# Patient Record
Sex: Female | Born: 1975 | Race: White | Hispanic: No | Smoking: Former smoker
Health system: Southern US, Community
[De-identification: ages and names within clinical notes are randomized; demographics above are authoritative.]

## PROBLEM LIST (undated history)

## (undated) ENCOUNTER — Emergency Department (HOSPITAL_COMMUNITY): Admission: EM | Payer: Medicaid Other

## (undated) DIAGNOSIS — S8290XA Unspecified fracture of unspecified lower leg, initial encounter for closed fracture: Secondary | ICD-10-CM

## (undated) DIAGNOSIS — N189 Chronic kidney disease, unspecified: Secondary | ICD-10-CM

## (undated) DIAGNOSIS — J449 Chronic obstructive pulmonary disease, unspecified: Secondary | ICD-10-CM

## (undated) DIAGNOSIS — R1310 Dysphagia, unspecified: Secondary | ICD-10-CM

## (undated) HISTORY — PX: TUBAL LIGATION: SHX77

## (undated) HISTORY — DX: Chronic obstructive pulmonary disease, unspecified: J44.9

## (undated) HISTORY — PX: FRACTURE SURGERY: SHX138

## (undated) HISTORY — DX: Dysphagia, unspecified: R13.10

## (undated) HISTORY — DX: Chronic kidney disease, unspecified: N18.9

---

## 2002-03-19 ENCOUNTER — Other Ambulatory Visit: Admission: RE | Admit: 2002-03-19 | Discharge: 2002-03-19 | Payer: Self-pay | Admitting: Internal Medicine

## 2005-01-27 ENCOUNTER — Ambulatory Visit: Payer: Self-pay | Admitting: Family Medicine

## 2005-04-07 ENCOUNTER — Ambulatory Visit: Payer: Self-pay | Admitting: Family Medicine

## 2005-04-07 ENCOUNTER — Encounter (INDEPENDENT_AMBULATORY_CARE_PROVIDER_SITE_OTHER): Payer: Self-pay | Admitting: *Deleted

## 2005-04-18 ENCOUNTER — Ambulatory Visit (HOSPITAL_COMMUNITY): Admission: RE | Admit: 2005-04-18 | Discharge: 2005-04-18 | Payer: Self-pay | Admitting: Family Medicine

## 2005-04-26 ENCOUNTER — Ambulatory Visit: Payer: Self-pay | Admitting: Family Medicine

## 2005-05-10 ENCOUNTER — Ambulatory Visit (HOSPITAL_COMMUNITY): Admission: RE | Admit: 2005-05-10 | Discharge: 2005-05-10 | Payer: Self-pay | Admitting: Family Medicine

## 2005-08-25 ENCOUNTER — Ambulatory Visit: Payer: Self-pay | Admitting: Family Medicine

## 2005-10-11 ENCOUNTER — Ambulatory Visit: Payer: Self-pay | Admitting: Internal Medicine

## 2005-12-08 ENCOUNTER — Ambulatory Visit (HOSPITAL_COMMUNITY): Admission: RE | Admit: 2005-12-08 | Discharge: 2005-12-08 | Payer: Self-pay | Admitting: Family Medicine

## 2005-12-08 ENCOUNTER — Ambulatory Visit: Payer: Self-pay | Admitting: Family Medicine

## 2006-05-10 ENCOUNTER — Emergency Department (HOSPITAL_COMMUNITY): Admission: EM | Admit: 2006-05-10 | Discharge: 2006-05-10 | Payer: Self-pay | Admitting: Emergency Medicine

## 2006-05-16 ENCOUNTER — Ambulatory Visit: Payer: Self-pay | Admitting: Family Medicine

## 2006-06-16 IMAGING — CT CT PELVIS W/ CM
1 of 3 series · 14 of 32 positions shown, 19 images · IV contrast (agent unspecified)
Comparison: Abdominal ultrasound 04/18/2005.

CLINICAL DATA: Six month history of abdominal pain and diarrhea.

CT ABDOMEN AND PELVIS WITH CONTRAST 05/10/2005:
TECHNIQUE: Multidetector helical CT of the abdomen and pelvis was performed
during bolus administration of intravenous contrast. Oral contrast was given.
Delayed imaging through the kidneys was performed.
Contrast:  150 cc 3mnipaque-GLL.

[Series 9436: — · axial · 0.85mm/px · z∈[+1399,+1859]mm · 14 of 106 slices shown, 19 images]
[im 7/106  soft-tissue]
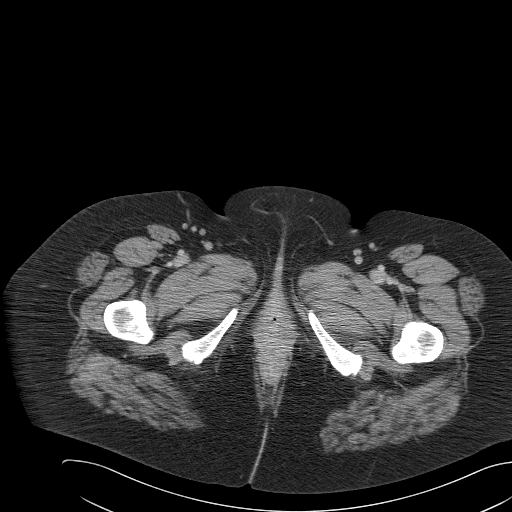
[im 7/106  bone]
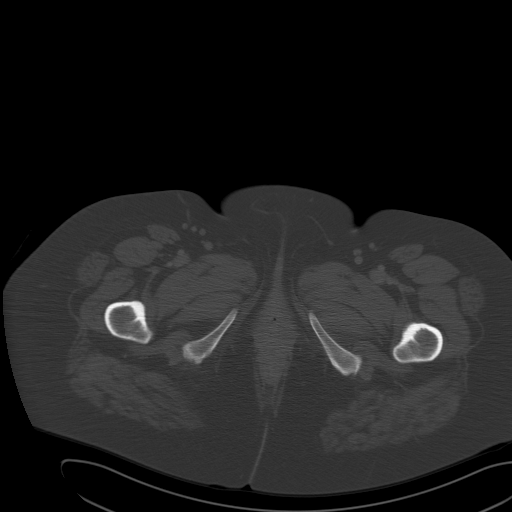
[im 13/106  soft-tissue]
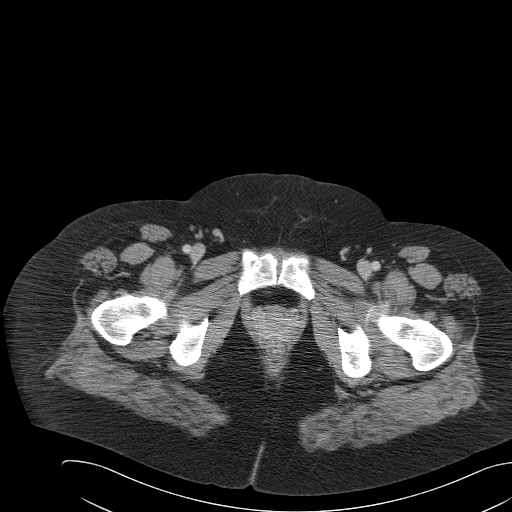
[im 25/106  soft-tissue]
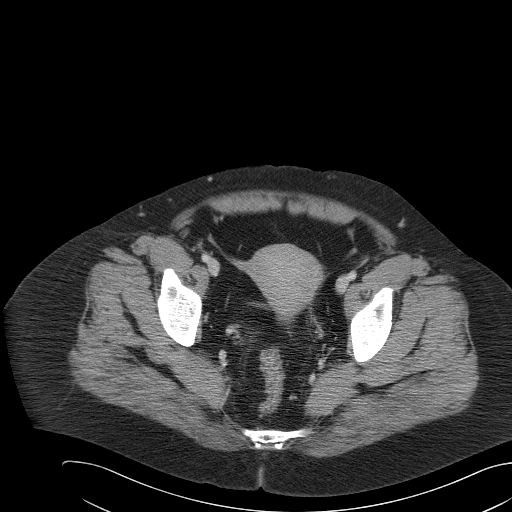
[im 31/106  soft-tissue]
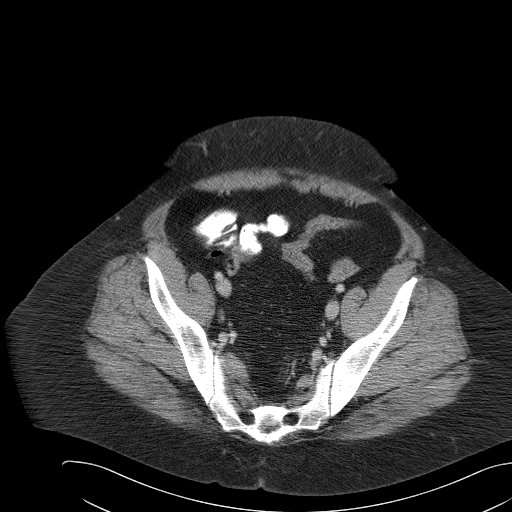
[im 38/106  soft-tissue]
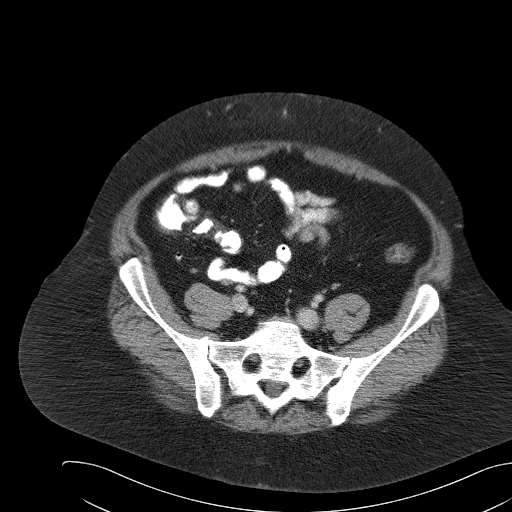
[im 44/106  soft-tissue]
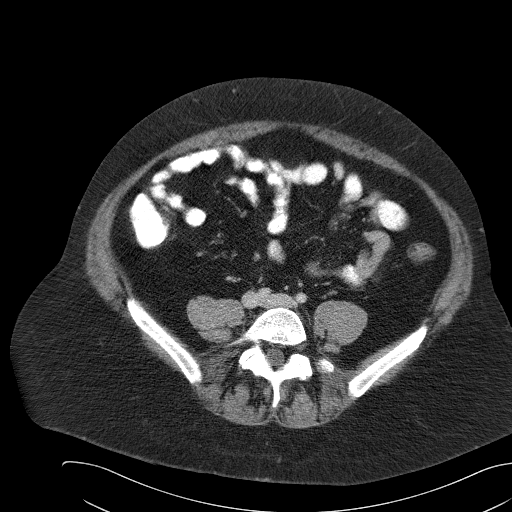
[im 56/106  soft-tissue]
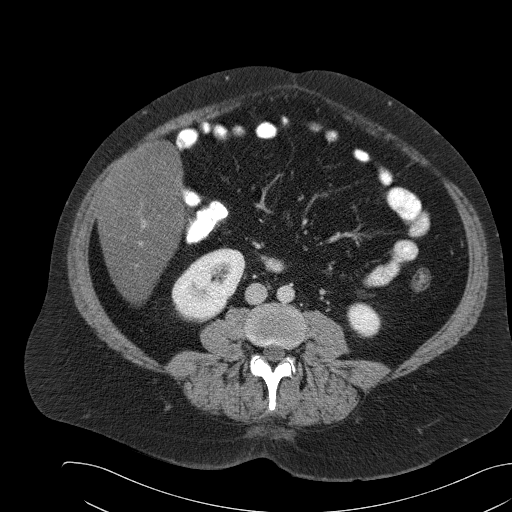
[im 62/106  soft-tissue]
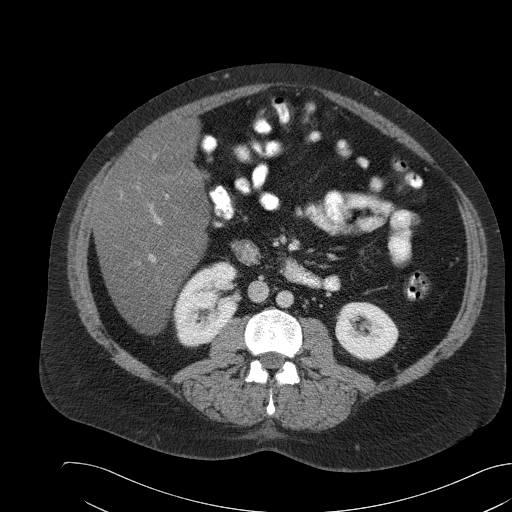
[im 68/106  soft-tissue]
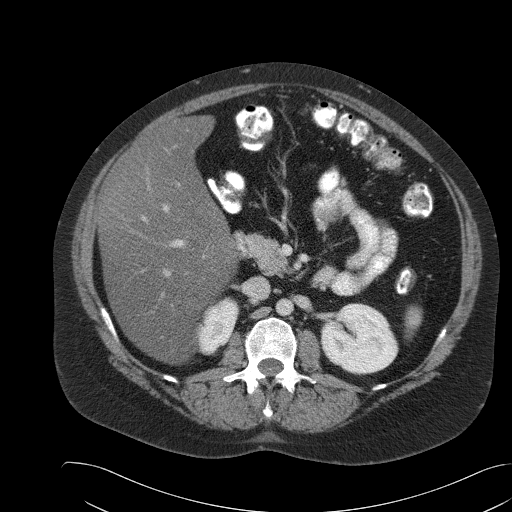
[im 68/106  bone]
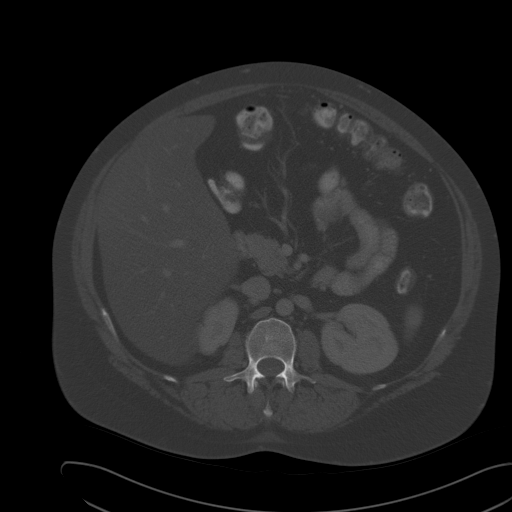
[im 75/106  soft-tissue]
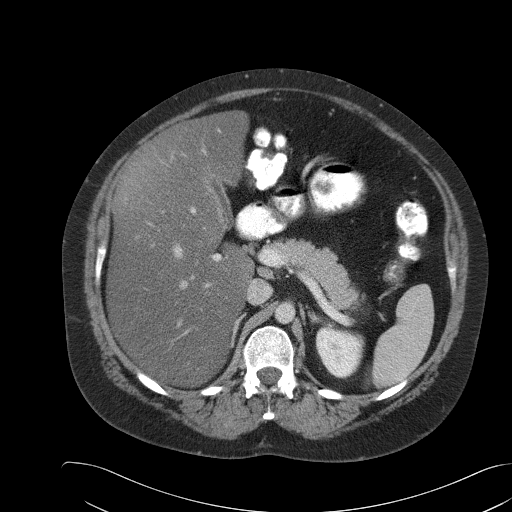
[im 81/106  soft-tissue]
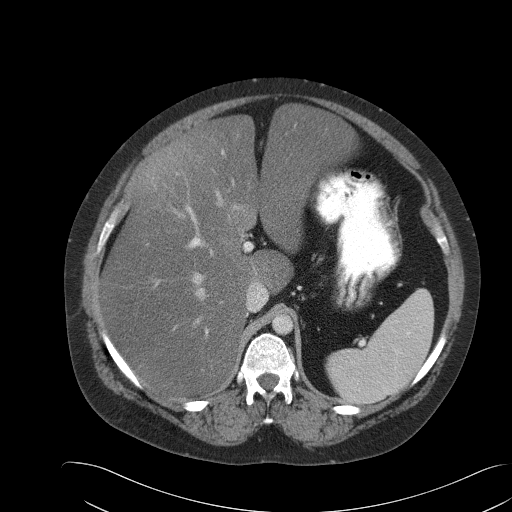
[im 81/106  lung]
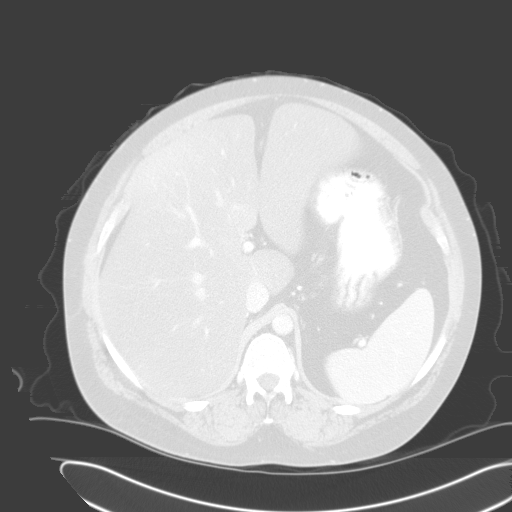
[im 87/106  lung]
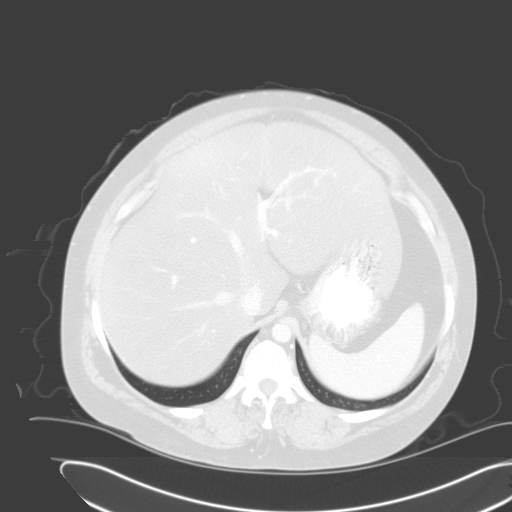
[im 93/106  soft-tissue]
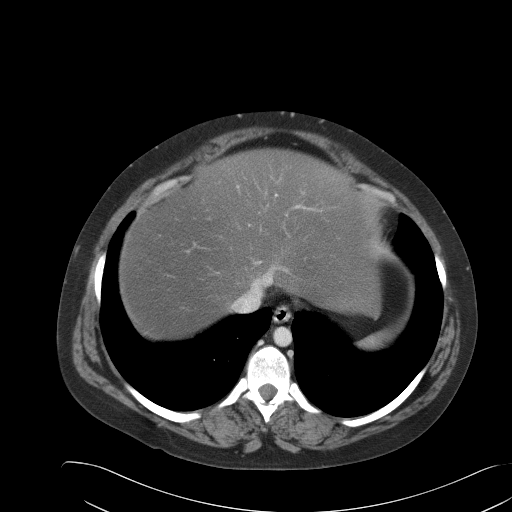
[im 93/106  lung]
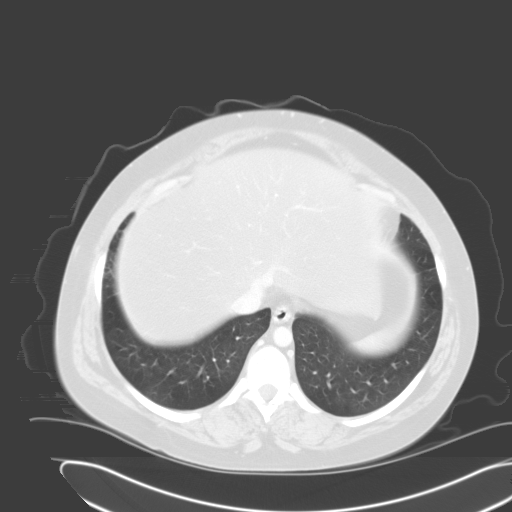
[im 99/106  soft-tissue]
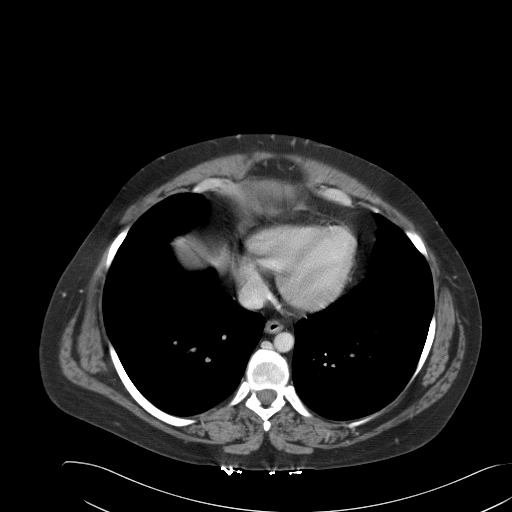
[im 99/106  lung]
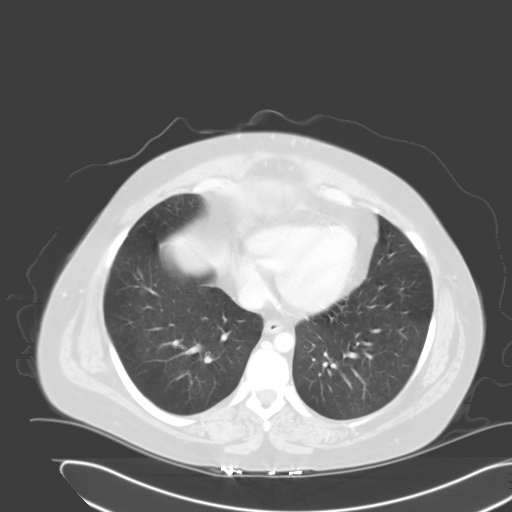

[14 of 32 positions shown; findings below may reference images not displayed]

No prior CT examinations.

CT ABDOMEN:

The liver is enlarged as was noted on the previous ultrasound. There is severe
diffuse fatty infiltration of the liver, with focal areas of sparing in the
periphery of the liver. No focal hepatic abnormalities are identified. The
spleen is normal in size and appearance. The pancreas is normal. The gallbladder
is contracted but otherwise unremarkable, and was normal on the prior
ultrasound. Both adrenal glands and both kidneys are normal in appearance. The
stomach and the visualized colon and small bowel are unremarkable in the
abdomen. There is no ascites. There is no significant lymphadenopathy. Abdominal
aorta is normal in appearance. The visualized lung bases appear clear.
IMPRESSION: 1. Marked hepatomegaly with diffuse fatty infiltration of the liver. No focal
hepatic abnormalities.
2. Normal CT of the abdomen otherwise.

CT PELVIS:

The sigmoid colon and rectum are decompressed, though no significant thickening
of the wall is identified. There is no evidence of diverticulosis. The small
bowel has normal appearance. The appendix is identified in the right mid pelvis
and is normal. The uterus and adnexa are unremarkable apart from small
calcifications in both ovaries. There is no free fluid. There is no significant
lymphadenopathy. Urinary bladder is decompressed and normal in appearance.
IMPRESSION: 1. No acute abnormalities in the pelvis.
2. Bilateral ovarian calcifications which are statistically likely to be benign.

## 2006-06-29 ENCOUNTER — Encounter (HOSPITAL_COMMUNITY): Admission: RE | Admit: 2006-06-29 | Discharge: 2006-07-29 | Payer: Self-pay | Admitting: Neurology

## 2006-08-08 ENCOUNTER — Encounter (HOSPITAL_COMMUNITY): Admission: RE | Admit: 2006-08-08 | Discharge: 2006-09-07 | Payer: Self-pay | Admitting: Neurology

## 2006-09-20 ENCOUNTER — Encounter (HOSPITAL_COMMUNITY): Admission: RE | Admit: 2006-09-20 | Discharge: 2006-09-23 | Payer: Self-pay | Admitting: Neurology

## 2006-09-26 ENCOUNTER — Encounter (HOSPITAL_COMMUNITY): Admission: RE | Admit: 2006-09-26 | Discharge: 2006-10-26 | Payer: Self-pay | Admitting: Neurology

## 2007-07-10 ENCOUNTER — Ambulatory Visit (HOSPITAL_COMMUNITY): Admission: RE | Admit: 2007-07-10 | Discharge: 2007-07-10 | Payer: Self-pay | Admitting: Family Medicine

## 2007-07-10 ENCOUNTER — Ambulatory Visit: Payer: Self-pay | Admitting: Family Medicine

## 2007-12-15 ENCOUNTER — Encounter (INDEPENDENT_AMBULATORY_CARE_PROVIDER_SITE_OTHER): Payer: Self-pay | Admitting: *Deleted

## 2008-03-14 DIAGNOSIS — E119 Type 2 diabetes mellitus without complications: Secondary | ICD-10-CM | POA: Insufficient documentation

## 2008-03-14 DIAGNOSIS — E785 Hyperlipidemia, unspecified: Secondary | ICD-10-CM | POA: Insufficient documentation

## 2009-04-17 ENCOUNTER — Encounter: Payer: Self-pay | Admitting: Family Medicine

## 2009-06-01 ENCOUNTER — Ambulatory Visit: Payer: Self-pay | Admitting: Family Medicine

## 2009-06-01 DIAGNOSIS — E669 Obesity, unspecified: Secondary | ICD-10-CM | POA: Insufficient documentation

## 2009-06-01 LAB — CONVERTED CEMR LAB: Glucose, Bld: 105 mg/dL

## 2009-06-03 DIAGNOSIS — F1010 Alcohol abuse, uncomplicated: Secondary | ICD-10-CM | POA: Insufficient documentation

## 2009-06-03 DIAGNOSIS — M79609 Pain in unspecified limb: Secondary | ICD-10-CM | POA: Insufficient documentation

## 2009-06-05 ENCOUNTER — Encounter: Payer: Self-pay | Admitting: Family Medicine

## 2009-06-17 ENCOUNTER — Telehealth: Payer: Self-pay | Admitting: Family Medicine

## 2009-06-18 ENCOUNTER — Telehealth: Payer: Self-pay | Admitting: Family Medicine

## 2010-02-19 ENCOUNTER — Encounter: Payer: Self-pay | Admitting: Family Medicine

## 2010-03-19 ENCOUNTER — Encounter: Payer: Self-pay | Admitting: Family Medicine

## 2011-01-16 ENCOUNTER — Encounter: Payer: Self-pay | Admitting: Orthopaedic Surgery

## 2011-01-16 ENCOUNTER — Encounter: Payer: Self-pay | Admitting: Family Medicine

## 2011-01-25 NOTE — Letter (Signed)
Summary: MEDICAL RELEASE  MEDICAL RELEASE   Imported By: Lind Guest 03/19/2010 10:01:35  _____________________________________________________________________  External Attachment:    Type:   Image     Comment:   External Document

## 2011-01-25 NOTE — Letter (Signed)
Summary: medical release  medical release   Imported By: Lind Guest 02/19/2010 14:42:26  _____________________________________________________________________  External Attachment:    Type:   Image     Comment:   External Document

## 2014-04-15 ENCOUNTER — Emergency Department (HOSPITAL_COMMUNITY): Payer: Self-pay

## 2014-04-15 ENCOUNTER — Encounter (HOSPITAL_COMMUNITY): Payer: Self-pay | Admitting: Emergency Medicine

## 2014-04-15 ENCOUNTER — Emergency Department (HOSPITAL_COMMUNITY)
Admission: EM | Admit: 2014-04-15 | Discharge: 2014-04-15 | Disposition: A | Discharge status: Home / Self Care | Payer: Self-pay | Attending: Emergency Medicine | Admitting: Emergency Medicine

## 2014-04-15 DIAGNOSIS — Y838 Other surgical procedures as the cause of abnormal reaction of the patient, or of later complication, without mention of misadventure at the time of the procedure: Secondary | ICD-10-CM | POA: Insufficient documentation

## 2014-04-15 DIAGNOSIS — L0291 Cutaneous abscess, unspecified: Secondary | ICD-10-CM

## 2014-04-15 DIAGNOSIS — T8189XA Other complications of procedures, not elsewhere classified, initial encounter: Secondary | ICD-10-CM | POA: Insufficient documentation

## 2014-04-15 DIAGNOSIS — F17200 Nicotine dependence, unspecified, uncomplicated: Secondary | ICD-10-CM | POA: Insufficient documentation

## 2014-04-15 DIAGNOSIS — L02419 Cutaneous abscess of limb, unspecified: Secondary | ICD-10-CM | POA: Insufficient documentation

## 2014-04-15 DIAGNOSIS — Z8781 Personal history of (healed) traumatic fracture: Secondary | ICD-10-CM | POA: Insufficient documentation

## 2014-04-15 DIAGNOSIS — L03119 Cellulitis of unspecified part of limb: Secondary | ICD-10-CM

## 2014-04-15 HISTORY — DX: Unspecified fracture of unspecified lower leg, initial encounter for closed fracture: S82.90XA

## 2014-04-15 LAB — COMPREHENSIVE METABOLIC PANEL
ALBUMIN: 3.4 g/dL — AB (ref 3.5–5.2)
ALK PHOS: 72 U/L (ref 39–117)
ALT: 9 U/L (ref 0–35)
AST: 12 U/L (ref 0–37)
BUN: 7 mg/dL (ref 6–23)
CO2: 27 mEq/L (ref 19–32)
Calcium: 9 mg/dL (ref 8.4–10.5)
Chloride: 101 mEq/L (ref 96–112)
Creatinine, Ser: 0.66 mg/dL (ref 0.50–1.10)
GFR calc Af Amer: >90 mL/min (ref >90)
GFR calc non Af Amer: >90 mL/min (ref >90)
Glucose, Bld: 101 mg/dL — ABNORMAL HIGH (ref 70–99)
POTASSIUM: 3.8 meq/L (ref 3.7–5.3)
SODIUM: 140 meq/L (ref 137–147)
TOTAL PROTEIN: 7.2 g/dL (ref 6.0–8.3)
Total Bilirubin: 0.4 mg/dL (ref 0.3–1.2)

## 2014-04-15 LAB — CBC WITH DIFFERENTIAL/PLATELET
BASOS ABS: 0 10*3/uL (ref 0.0–0.1)
BASOS PCT: 0 % (ref 0–1)
EOS ABS: 0.1 10*3/uL (ref 0.0–0.7)
Eosinophils Relative: 1 % (ref 0–5)
HCT: 42.6 % (ref 36.0–46.0)
Hemoglobin: 14.1 g/dL (ref 12.0–15.0)
Lymphocytes Relative: 26 % (ref 12–46)
Lymphs Abs: 2.1 10*3/uL (ref 0.7–4.0)
MCH: 33.1 pg (ref 26.0–34.0)
MCHC: 33.1 g/dL (ref 30.0–36.0)
MCV: 100 fL (ref 78.0–100.0)
Monocytes Absolute: 0.6 10*3/uL (ref 0.1–1.0)
Monocytes Relative: 7 % (ref 3–12)
NEUTROS PCT: 66 % (ref 43–77)
Neutro Abs: 5.4 10*3/uL (ref 1.7–7.7)
PLATELETS: 200 10*3/uL (ref 150–400)
RBC: 4.26 MIL/uL (ref 3.87–5.11)
RDW: 12.9 % (ref 11.5–15.5)
WBC: 8.2 10*3/uL (ref 4.0–10.5)

## 2014-04-15 LAB — C-REACTIVE PROTEIN: CRP: 0.8 mg/dL — ABNORMAL HIGH (ref <0.60)

## 2014-04-15 LAB — SEDIMENTATION RATE: Sed Rate: 23 mm/hr — ABNORMAL HIGH (ref 0–22)

## 2014-04-15 MED ORDER — GADOBENATE DIMEGLUMINE 529 MG/ML IV SOLN
15.0000 mL | Freq: Once | INTRAVENOUS | Status: AC | PRN
  Administered 2014-04-15: 15 mL via INTRAVENOUS

## 2014-04-15 MED ORDER — DOXYCYCLINE HYCLATE 100 MG PO CAPS: 100.0000 mg | ORAL_CAPSULE | Freq: Two times a day (BID) | ORAL | Status: DC

## 2014-04-15 MED ORDER — LIDOCAINE HCL (PF) 1 % IJ SOLN
30.0000 mL | Freq: Once | INTRAMUSCULAR | Status: DC
  Filled 2014-04-15: qty 30

## 2014-04-15 MED ORDER — CEPHALEXIN 500 MG PO CAPS: 500.0000 mg | ORAL_CAPSULE | Freq: Four times a day (QID) | ORAL | Status: DC

## 2014-04-15 NOTE — ED Provider Notes (Signed)
CSN: [id number]     Arrival date & time 04/15/14  1338 History   First MD Initiated Contact with Patient 04/15/14 1541     Chief Complaint  Patient presents with  . Leg Swelling     (Consider location/radiation/quality/duration/timing/severity/associated sxs/prior Treatment) HPI Comments: Patient had swelling and draining lesions to her left lower leg since having surgery for a fracture one year ago by Dr. Nori Riis in [place]. She states the surgeon has "disappeared." comes in today with painful swelling to her left lower leg and draining lesions. Denies any fever or vomiting. She has had no chest pain or shortness of breath. No abdominal pain, nausea or vomiting. She states she does have hardware in place. He states she's had lesions coming in various stages of healing with drainage of purulent bloody material for the past several months.   The history is provided by the patient.    Past Medical History  Diagnosis Date  . Lower leg fracture    Past Surgical History  Procedure Laterality Date  . Fracture surgery    . Tubal ligation     History reviewed. No pertinent family history. History  Substance Use Topics  . Smoking status: Current Every Day Smoker    Types: Cigarettes  . Smokeless tobacco: Not on file  . Alcohol Use: Yes     Comment: weekend use   OB History   Grav Para Term Preterm Abortions TAB SAB Ect Mult Living                 Review of Systems  Constitutional: Negative for fever, activity change and appetite change.  HENT: Negative for congestion and rhinorrhea.   Respiratory: Negative for cough, chest tightness and shortness of breath.   Cardiovascular: Positive for leg swelling. Negative for chest pain.  Gastrointestinal: Negative for nausea, vomiting, abdominal pain and rectal pain.  Genitourinary: Negative for dysuria, hematuria, vaginal bleeding and vaginal discharge.  Musculoskeletal: Negative for back pain.  Skin: Positive for rash and wound.  Neurological:  Negative for dizziness, weakness and headaches.  A complete 10 system review of systems was obtained and all systems are negative except as noted in the HPI and PMH.      Allergies  Sulfa antibiotics  Home Medications   Prior to Admission medications   Not on File   BP 144/86  Pulse 81  Temp(Src) 98 F (36.7 C) (Oral)  Resp 18  Ht _0  (1.753 m)  Wt 160 lb (72.576 kg)  BMI 23.62 kg/m2  SpO2 98%  LMP 03/09/2014 Physical Exam  Constitutional: She is oriented to person, place, and time. She appears well-developed and well-nourished. No distress.  HENT:  Head: Normocephalic and atraumatic.  Mouth/Throat: Oropharynx is clear and moist. No oropharyngeal exudate.  Eyes: Conjunctivae and EOM are normal. Pupils are equal, round, and reactive to light.  Neck: Normal range of motion. Neck supple.  Cardiovascular: Normal rate, regular rhythm and normal heart sounds.   Pulmonary/Chest: Effort normal and breath sounds normal. No respiratory distress.  Abdominal: Soft. There is no tenderness. There is no rebound and no guarding.  Musculoskeletal: Normal range of motion. She exhibits edema and tenderness.  Left lower leg has chronic skin changes with areas of erythema and swelling Some areas of purulent drainage. Intact DP and PT pulses. Compartments soft. See photograph   Neurological: She is alert and oriented to person, place, and time. No cranial nerve deficit. She exhibits normal muscle tone. Coordination normal.  Skin: Skin is  warm.     ED Course  INCISION AND DRAINAGE Date/Time: 04/15/2014 8:55 PM Performed by: Ezequiel Essex Authorized by: Ezequiel Essex Consent: Verbal consent obtained. Risks and benefits: risks, benefits and alternatives were discussed Consent given by: patient Patient understanding: patient states understanding of the procedure being performed Patient consent: the patient's understanding of the procedure matches consent given Procedure consent:  procedure consent matches procedure scheduled Patient identity confirmed: verbally with patient and provided demographic data Time out: Immediately prior to procedure a "time out" was called to verify the correct patient, procedure, equipment, support staff and site/side marked as required. Type: abscess Body area: lower extremity Location details: left leg Anesthesia: local infiltration Local anesthetic: lidocaine 1% without epinephrine Anesthetic total: 4 ml Scalpel size: 11 Incision type: single straight Complexity: complex Drainage: purulent Drainage amount: moderate Wound treatment: wound left open Packing material: none Patient tolerance: Patient tolerated the procedure well with no immediate complications.   (including critical care time) Labs Review Labs Reviewed  COMPREHENSIVE METABOLIC PANEL - Abnormal; Notable for the following:    Glucose, Bld 101 (*)    Albumin 3.4 (*)    All other components within normal limits  CBC WITH DIFFERENTIAL  SEDIMENTATION RATE  C-REACTIVE PROTEIN    Imaging Review Dg Tibia/fibula Left  04/15/2014   CLINICAL DATA:  Pain.  Cutaneous manifestations.  Previous surgery.  EXAM: LEFT TIBIA AND FIBULA - 2 VIEW  COMPARISON:  None.  FINDINGS: The patient is status post open reduction internal fixation of distal tibia and fibular fractures. No evidence for nonunion. Slight loosening of several screws. Hypertrophic change surrounds the outer margin of the tibial plane. Moderate soft tissue swelling is noted medially. Infection not excluded.  IMPRESSION: Moderate medial soft tissue swelling. No acute osseous findings. No visible nonunion or osteomyelitis.   Electronically Signed   By: Rolla Flatten M.D.   On: 04/15/2014 16:30   Dg Ankle Complete Left  04/15/2014   CLINICAL DATA:  Left lower leg swelling.  EXAM: LEFT ANKLE COMPLETE - 3+ VIEW  COMPARISON:  January 12, 2014.  FINDINGS: Status post internal fixation of old distal fibular and tibial  fractures. No acute fracture or dislocation is noted. Talar dome appears intact. Soft tissue swelling is seen over medial malleolus suggesting ligamentous injury or inflammation.  IMPRESSION: Status post internal fixation of old fractures involving the distal left tibia and fibula. No acute fracture or dislocation is noted. Soft tissue swelling is seen over medial malleolus suggesting ligamentous injury or inflammation.   Electronically Signed   By: Sabino Dick M.D.   On: 04/15/2014 16:31   Mr Ankle Left W Wo Contrast  04/15/2014   CLINICAL DATA:  Left ankle pain. History of prior surgery for fracture fixation. Draining wounds.  EXAM: MRI OF THE LEFT ANKLE WITHOUT AND WITH CONTRAST  TECHNIQUE: Multiplanar, multisequence MR imaging of the left ankle was performed before and after the administration of intravenous contrast.  CONTRAST:  15 mL MULTIHANCE GADOBENATE DIMEGLUMINE 529 MG/ML IV SOLN  COMPARISON:  Plain films 04/15/2014 and 1612 hours  FINDINGS: There is some artifact from plate and screws fixing distal tibial and fibular fractures. Given this limitation, no bone marrow signal abnormality to suggest osteomyelitis is identified. No fluid collection is seen. The patient's draining wounds are not visualized. Visualized muscles and tendons appear intact. Ligaments about the ankle are obscured by artifact from hardware.  IMPRESSION: Artifact from hardware somewhat limits the study. The patient's draining wounds are not visualized. No abscess  or evidence of osteomyelitis is seen. No acute abnormality is identified.   Electronically Signed   By: Inge Rise M.D.   On: 04/15/2014 20:33   US Venous Img Lower Unilateral Left  04/15/2014   CLINICAL DATA:  Leg wounds, redness, pain, swelling. Previous left tib-fib internal fixation.  EXAM: LEFT LOWER EXTREMITY VENOUS DOPPLER ULTRASOUND  TECHNIQUE: Gray-scale sonography with compression, as well as color and duplex ultrasound, were performed to evaluate the  deep venous system from the level of the common femoral vein through the popliteal and proximal calf veins.  COMPARISON:  None  FINDINGS: Normal compressibility of the common femoral, superficial femoral, and popliteal veins, as well as the proximal calf veins. No filling defects to suggest DVT on grayscale or color Doppler imaging. Doppler waveforms show normal direction of venous flow, normal respiratory phasicity and response to augmentation. Prominent left inguinal lymph node measured 15 mm short axis diameter.  IMPRESSION: 1. No evidence of  lower extremity deep vein thrombosis. 2. Left inguinal adenopathy, possibly reactive but nonspecific .   Electronically Signed   By: Arne Cleveland M.D.   On: 04/15/2014 17:16     EKG Interpretation None      MDM   Final diagnoses:  Nonhealing surgical wound  Abscess   Left leg pain, swelling, drainage since surgery one year ago. Unable to follow up with surgeon. Patient with areas of abscess and cellulitis. Neurovascularly intact. We'll check x-ray to evaluate hardware.  X-ray show no evidence of hardware failure. There is soft tissue swelling without evidence of osteomyelitis. WBC normal. ESR and CRP pending. Ultrasound negative for DVT. No evidence of osteomyelitis on MRI.  Small abscess drained as above. Case discussed with Dr. Aline Brochure of orthopedics. Patient states her surgery was done in December 2013 by Dr. Arnetha Gula in [place]. Dr. Aline Brochure is not familiar with the surgeon. Unable to locate his information. Dr. Aline Brochure feels patient will need specialty care at tertiary center. He is agreeable to see her in his office this week. He'll refer her to the proper place. He agrees with antibiotics. No indication for admission tonight.     Ezequiel Essex, MD 04/15/14 2214

## 2014-04-15 NOTE — Discharge Instructions (Signed)
Abscess Follow up with Dr. Harrison this week. [facility]. Return to the ED if you develop new or worsening symptoms. An abscess is an infected area that contains a collection of pus and debris.It can occur in almost any part of the body. An abscess is also known as a furuncle or boil. CAUSES  An abscess occurs when tissue gets infected. This can occur from blockage of oil or sweat glands, infection of hair follicles, or a minor injury to the skin. As the body tries to fight the infection, pus collects in the area and creates pressure under the skin. This pressure causes pain. People with weakened immune systems have difficulty fighting infections and get certain abscesses more often.  SYMPTOMS Usually an abscess develops on the skin and becomes a painful mass that is red, warm, and tender. If the abscess forms under the skin, you may feel a moveable soft area under the skin. Some abscesses break open (rupture) on their own, but most will continue to get worse without care. The infection can spread deeper into the body and eventually into the bloodstream, causing you to feel ill.  DIAGNOSIS  Your caregiver will take your medical history and perform a physical exam. A sample of fluid may also be taken from the abscess to determine what is causing your infection. TREATMENT  Your caregiver may prescribe antibiotic medicines to fight the infection. However, taking antibiotics alone usually does not cure an abscess. Your caregiver may need to make a small cut (incision) in the abscess to drain the pus. In some cases, gauze is packed into the abscess to reduce pain and to continue draining the area. HOME CARE INSTRUCTIONS   Only take over-the-counter or prescription medicines for pain, discomfort, or fever as directed by your caregiver.  If you were prescribed antibiotics, take them as directed. Finish them even if you start to feel better.  If gauze  is used, follow your caregiver's directions for changing the gauze.  To avoid spreading the infection:  Keep your draining abscess covered with a bandage.  Wash your hands well.  Do not share personal care items, towels, or whirlpools with others.  Avoid skin contact with others.  Keep your skin and clothes clean around the abscess.  Keep all follow-up appointments as directed by your caregiver. SEEK MEDICAL CARE IF:   You have increased pain, swelling, redness, fluid drainage, or bleeding.  You have muscle aches, chills, or a general ill feeling.  You have a fever. MAKE SURE YOU:   Understand these instructions.  Will watch your condition.  Will get help right away if you are not doing well or get worse. Document Released: 09/21/2005 Document Revised: 06/12/2012 Document Reviewed: 02/24/2012 [facility] Patient Information 2014 [place], [place].

## 2014-04-15 NOTE — ED Notes (Signed)
In to see patient. But patient is now being taken to get procedure done. Will check on patient when she comes back to her room.

## 2014-04-15 NOTE — ED Notes (Signed)
Pt with swelling and lesions to left lower leg since her surgery about a year ago due to a fracture, pt states it never healed right and has open wound to site ever since, unable to see surgeon -states" he just vanished"

## 2014-04-21 ENCOUNTER — Ambulatory Visit (INDEPENDENT_AMBULATORY_CARE_PROVIDER_SITE_OTHER): Payer: Self-pay | Admitting: Orthopedic Surgery

## 2014-04-21 VITALS — BP 170/145 | Ht 69.0 in | Wt 160.0 lb

## 2014-04-21 DIAGNOSIS — T8484XA Pain due to internal orthopedic prosthetic devices, implants and grafts, initial encounter: Secondary | ICD-10-CM | POA: Insufficient documentation

## 2014-04-21 DIAGNOSIS — T8489XA Other specified complication of internal orthopedic prosthetic devices, implants and grafts, initial encounter: Secondary | ICD-10-CM

## 2014-04-21 DIAGNOSIS — T847XXA Infection and inflammatory reaction due to other internal orthopedic prosthetic devices, implants and grafts, initial encounter: Secondary | ICD-10-CM

## 2014-04-21 NOTE — Patient Instructions (Addendum)
[facility] (Appointment May 4th, 2015 at 8:00 am Arrive at 7:45 am) [facility] to [facility]  Smoking Cessation, Tips for Success If you are ready to quit smoking, congratulations! You have chosen to help yourself be healthier. Cigarettes bring nicotine, tar, carbon monoxide, and other irritants into your body. Your lungs, heart, and blood vessels will be able to work better without these poisons. There are many different ways to quit smoking. Nicotine gum, nicotine patches, a nicotine inhaler, or nicotine nasal spray can help with physical craving. Hypnosis, support groups, and medicines help break the habit of smoking. WHAT THINGS CAN I DO TO MAKE QUITTING EASIER?  Here are some tips to help you quit for good:  Pick a date when you will quit smoking completely. Tell all of your friends and family about your plan to quit on that date.  Do not try to slowly cut down on the number of cigarettes you are smoking. Pick a quit date and quit smoking completely starting on that day.  Throw away all cigarettes.   Clean and remove all ashtrays from your home, work, and car.   On a card, write down your reasons for quitting. Carry the card with you and read it when you get the urge to smoke.   Cleanse your body of nicotine. Drink enough water and fluids to keep your urine clear or pale yellow. Do this after quitting to flush the nicotine from your body.   Learn to predict your moods. Do not let a bad situation be your excuse to have a cigarette. Some situations in your life might tempt you into wanting a cigarette.   Never have "just one" cigarette. It leads to wanting another and another. Remind yourself of your decision to quit.   Change habits associated with smoking. If you smoked while driving or when feeling stressed, try other activities to replace smoking. Stand up when drinking your coffee. Brush your teeth after eating. Sit in a different  chair when you read the paper. Avoid alcohol while trying to quit, and try to drink fewer caffeinated beverages. Alcohol and caffeine may urge you to smoke.   Avoid foods and drinks that can trigger a desire to smoke, such as sugary or spicy foods and alcohol.   Ask people who smoke not to smoke around you.   Have something planned to do right after eating or having a cup of coffee. For example, plan to take a walk or exercise.   Try a relaxation exercise to calm you down and decrease your stress. Remember, you may be tense and nervous for the first 2 weeks after you quit, but this will pass.   Find new activities to keep your hands busy. Play with a pen, coin, or rubber band. Doodle or draw things on paper.   Brush your teeth right after eating. This will help cut down on the craving for the taste of tobacco after meals. You can also try mouthwash.   Use oral substitutes in place of cigarettes. Try using lemon drops, carrots, cinnamon sticks, or chewing gum. Keep them handy so they are available when you have the urge to smoke.   When you have the urge to smoke, try deep breathing.   Designate your home as a nonsmoking area.   If you are a heavy smoker, ask your health care provider about a prescription for nicotine chewing gum. It can ease your withdrawal from nicotine.  Reward yourself. Set aside the cigarette money you save and buy yourself something nice.   Look for support from others. Join a support group or smoking cessation program. Ask someone at home or at work to help you with your plan to quit smoking.   Always ask yourself, "Do I need this cigarette or is this just a reflex?" Tell yourself, "Today, I choose not to smoke," or "I do not want to smoke." You are reminding yourself of your decision to quit.  Do not replace cigarette smoking with electronic cigarettes (commonly called e-cigarettes). The safety of e-cigarettes is unknown, and some may contain harmful  chemicals.  If you relapse, do not give up! Plan ahead and think about what you will do the next time you get the urge to smoke.  HOW WILL I FEEL WHEN I QUIT SMOKING? You may have symptoms of withdrawal because your body is used to nicotine (the addictive substance in cigarettes). You may crave cigarettes, be irritable, feel very hungry, cough often, get headaches, or have difficulty concentrating. The withdrawal symptoms are only temporary. They are strongest when you first quit but will go away within 10 14 days. When withdrawal symptoms occur, stay in control. Think about your reasons for quitting. Remind yourself that these are signs that your body is healing and getting used to being without cigarettes. Remember that withdrawal symptoms are easier to treat than the major diseases that smoking can cause.  Even after the withdrawal is over, expect periodic urges to smoke. However, these cravings are generally short lived and will go away whether you smoke or not. Do not smoke!  WHAT RESOURCES ARE AVAILABLE TO HELP ME QUIT SMOKING? Your health care provider can direct you to community resources or hospitals for support, which may include:  Group support.  Education.  Hypnosis.  Therapy. Document Released: 09/09/2004 Document Revised: 10/02/2013 Document Reviewed: 05/30/2013 [facility] Patient Information 2014 [place], [place].

## 2014-04-21 NOTE — Progress Notes (Signed)
Patient ID: Katie Young, female   DOB: 05/06/1976, 38 y.o.   MRN: [id number]  New  Chief Complaint  Patient presents with  . Ankle Problem    Left ankle wound. Follow up from ER. DOI 12-07-12.   HISTORY: This patient has been referred to us from the emergency room.  The patient reports the following: On December 13th 2013 the patient was carrying a baby up some steps and fell and injured her left distal tibia and fibula. She was operated [facility] Bruna PotterSteven Neil [facility] with open treatment internal fixation of the tibia and fibula from distal tib-fib fracture. The surgery was performed approximately 3 days after the initial injury.  In January 2014 she developed infection and had a second surgery with irrigation debridement and was placed on oral sulfa medication which she was allergic to. She was changed to another oral antibiotic which she does not remember the name. Since that time she's had persistent drainage from the left medial surgical wound. She indicates she stopped seeing the operating physician because she didn't have funds.  [facility] [place] [facility] and was referred to our office for further management.  She now has a draining medial wound with 7 areas of open drainage some purulent. She complains of toothache-like pain at rest and with walking. She does ambulate with a limp. She has reasonable range of motion with plantar flexion and some limitations with dorsiflexion.-related symptoms also include tingling and swelling the pain is constant.  Her review of systems is negative except for the following: Wheezing, cough, tightness of the chest. Heartburn. Redness and poor healing of the skin related to the surgical wounds. Unsteady gait. Swelling and redness of the leg.  Allergies to medications sulfa  No major medical problems  She also had open treatment internal fixation right ankle 15 years ago she's  also had a tubal ligation  She is on no chronic medications  Family history of asthma and cancer  Social history she is divorced, she does not work. She smokes pack cigarettes per day. She maybe has 1-2 drinks a week. She has completed her GED and one year of college.  BP 170/145  Ht 5\' 9"  (1.753 m)  Wt 160 lb (72.576 kg)  BMI 23.62 kg/m2  LMP 03/09/2014 General appearance is normal, the patient is alert and oriented x3 with normal mood and affect. Her dentition is poor, her skin is poor as well. She appears to be malnourished. She does ambulate with a limp favoring the left side.  Upper extremity exam  The right and left upper extremity:   Inspection and palpation revealed no abnormalities in the upper extremities.   Range of motion is full without contracture.  Motor exam is normal with grade 5 strength.  The joints are fully reduced without subluxation.  There is no atrophy or tremor and muscle tone is normal.  All joints are stable.  The right lower extremity is normal.  Left lower extremity is a lateral wound over the fibula which is intact slight hypertrophy but otherwise normal and nontender. The medial wound has healed over but there are several pinpoint areas of drainage numbering 72 of them have purulence.  The ankle dorsiflexion is 10 plantar flexion is 30. There is tenderness over the distal portion of the leg. There is slight redness and purple discoloration near the areas of drainage but no cellulitis or erythema. Ankle and knee  stability are confirmatory test. Muscle tone is normal. Skin as described. The dorsalis pedis pulse. Normal sensation in the foot.  Lymph nodes lower extremity negative.  Balance normal.  Imaging studies include the following which were done to save the 21st 2015 I reviewed the studies. She has a healed distal tib-fib fracture with internal fixation on the tibia and fibula with plating with synostosis on the distal tibia and fibula.  Joint space fairly well-maintained. She had an ultrasound for DVT which was normal. She also had an MRI which showed no abscess or osteomyelitis.  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 Potential treatment may include incision drainage debridement plus or minus hardware removal plus or minus wound VAC with IV antibiotics and short-term IV line placement.

## 2014-04-23 ENCOUNTER — Other Ambulatory Visit: Payer: Self-pay | Admitting: *Deleted

## 2014-04-23 DIAGNOSIS — T847XXA Infection and inflammatory reaction due to other internal orthopedic prosthetic devices, implants and grafts, initial encounter: Secondary | ICD-10-CM

## 2015-05-22 IMAGING — US US EXTREM LOW VENOUS*L*
1 series · 14 of 24 positions shown · non-contrast
Comparison: None

CLINICAL DATA: Leg wounds, redness, pain, swelling. Previous left
tib-fib internal fixation.

EXAM:
LEFT LOWER EXTREMITY VENOUS DOPPLER ULTRASOUND
TECHNIQUE: Gray-scale sonography with compression, as well as color and duplex
ultrasound, were performed to evaluate the deep venous system from
the level of the common femoral vein through the popliteal and
proximal calf veins.

[Series 1: us extrem low venous*left* · 0.05mm/px · 14 of 37 slices shown]
[im 1/37]
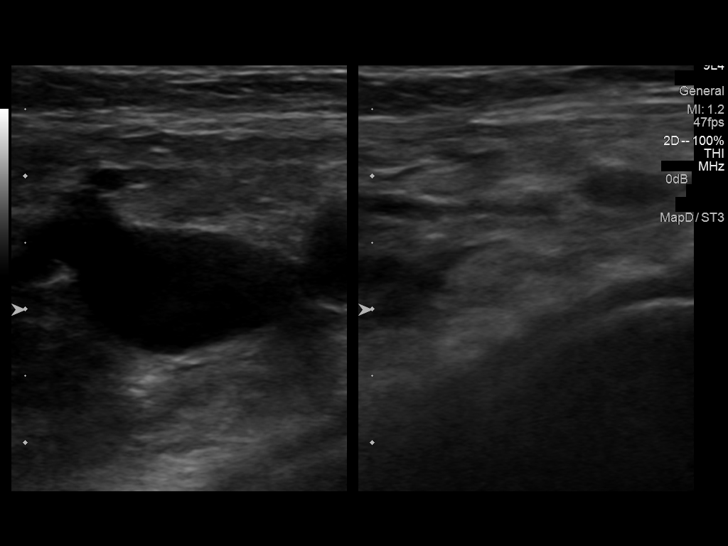
[im 4/37]
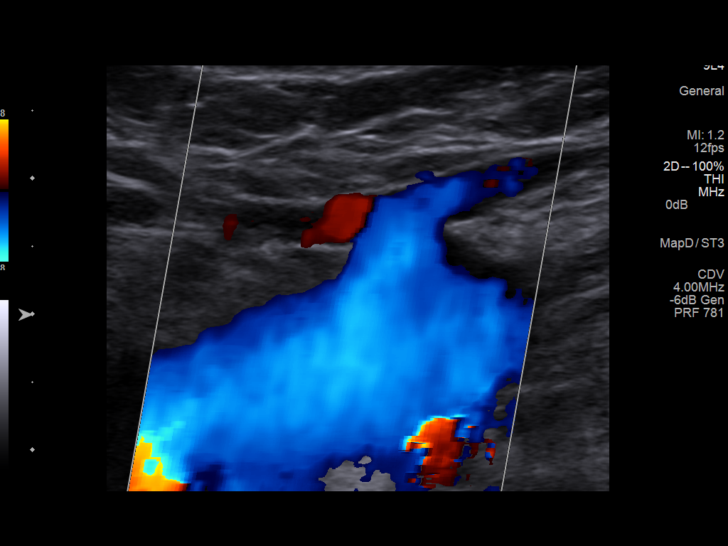
[im 7/37]
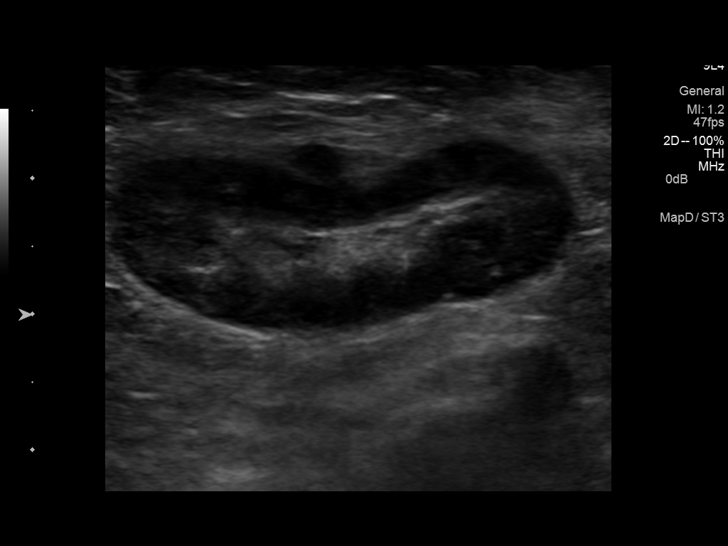
[im 10/37]
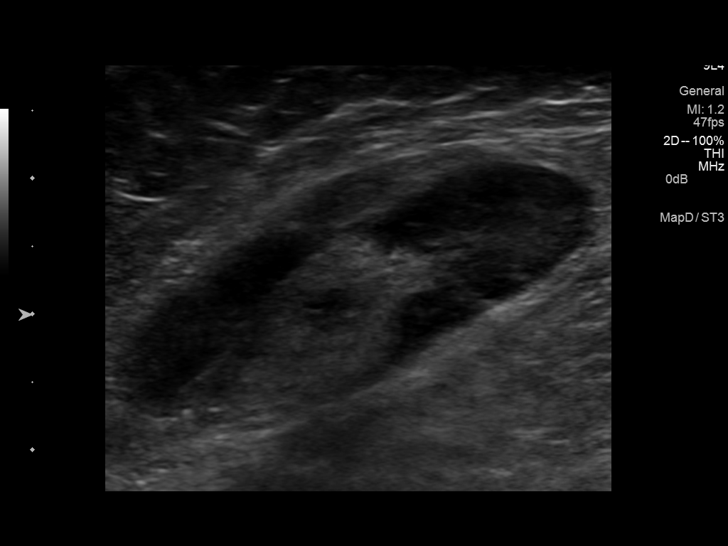
[im 11/37]
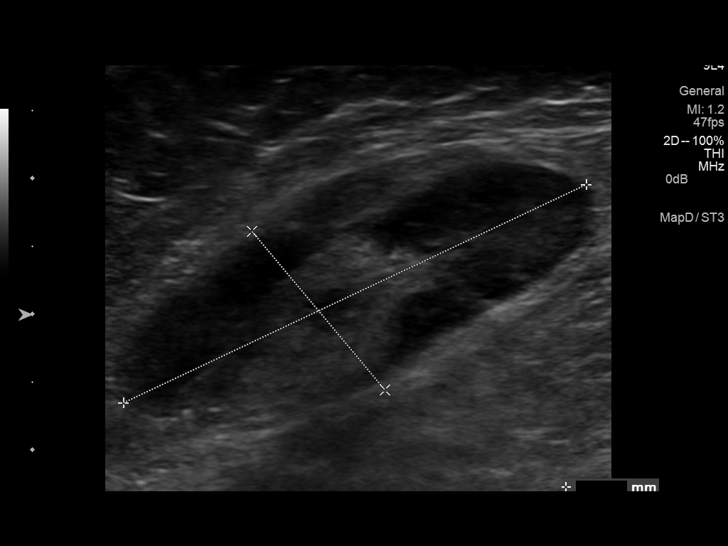
[im 15/37]
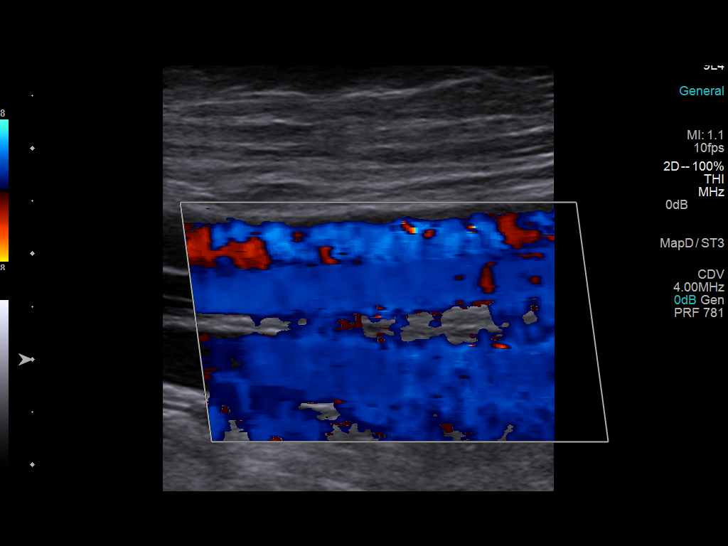
[im 18/37]
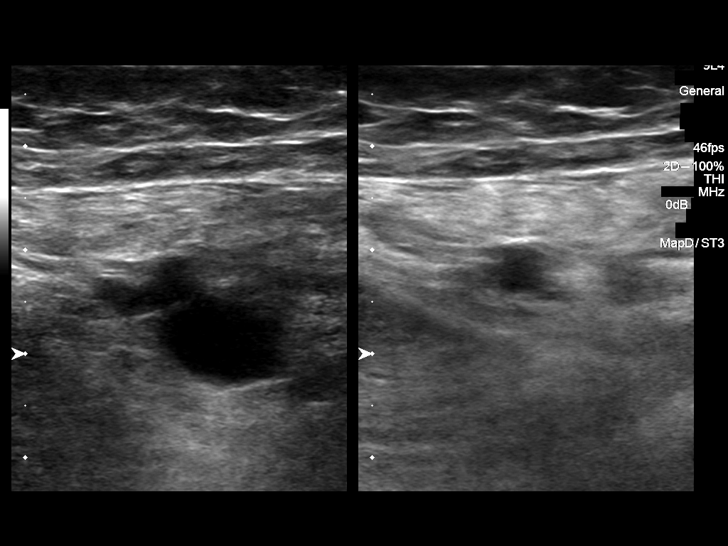
[im 19/37]
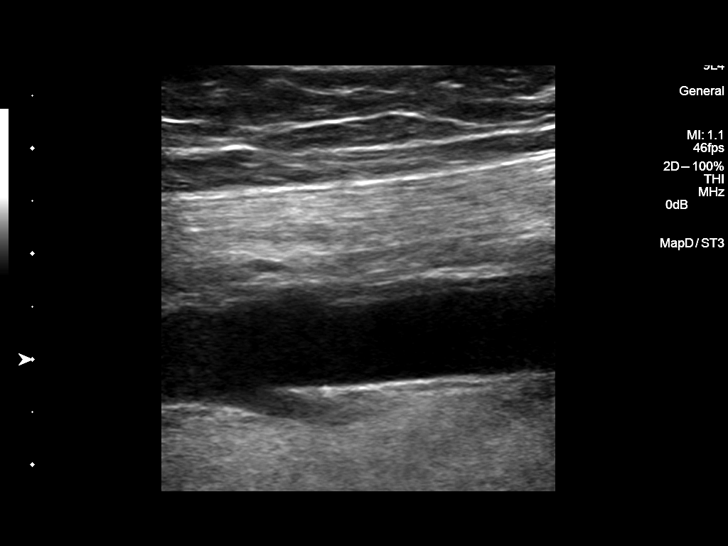
[im 22/37]
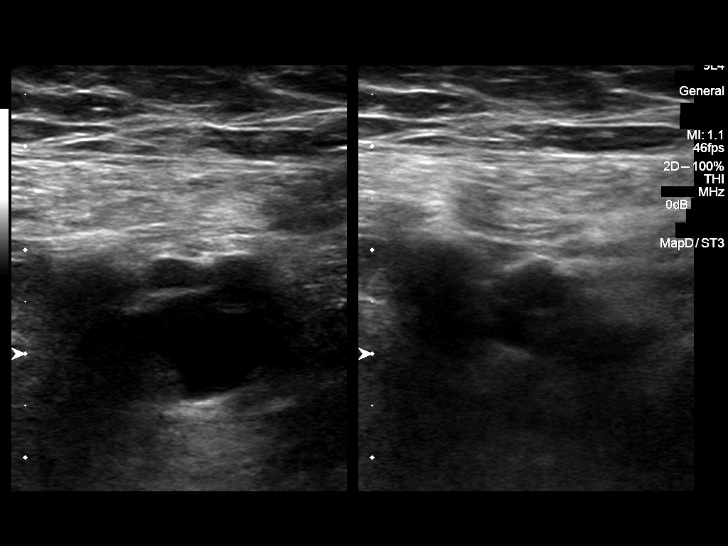
[im 26/37]
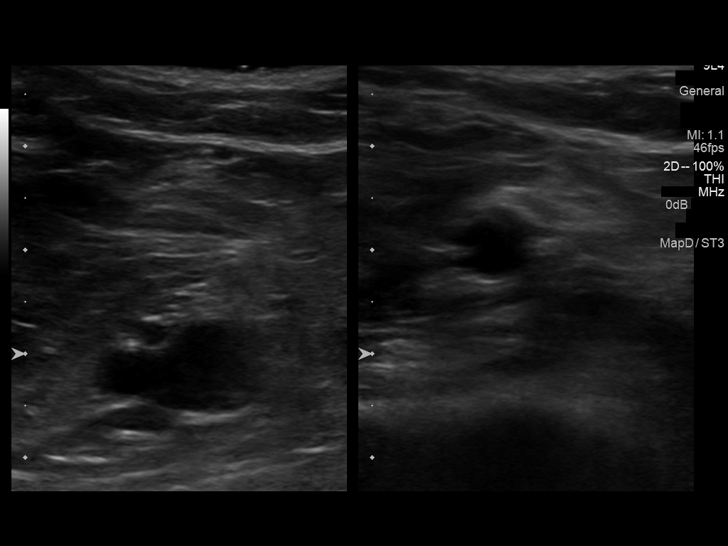
[im 29/37]
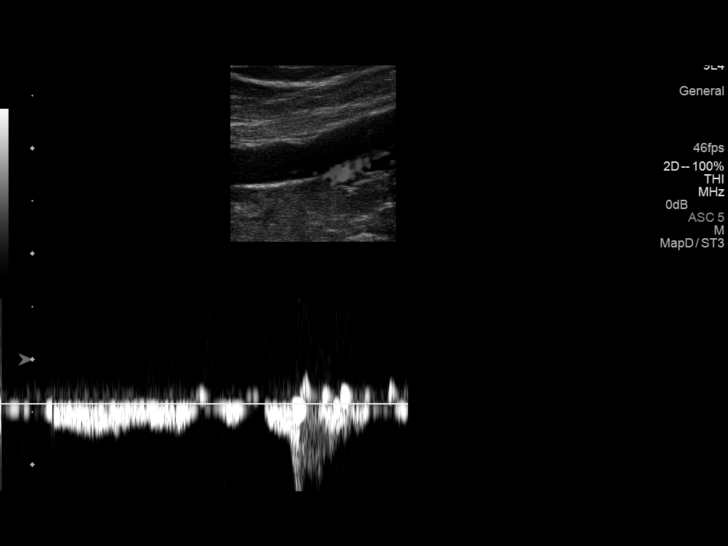
[im 30/37]
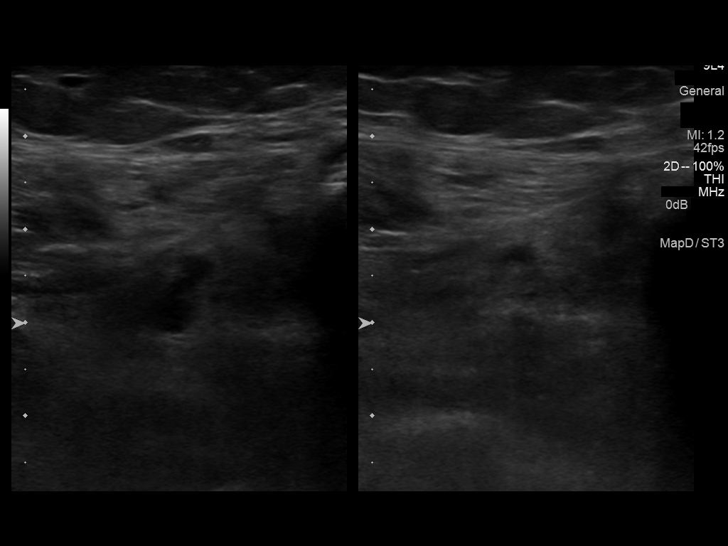
[im 33/37]
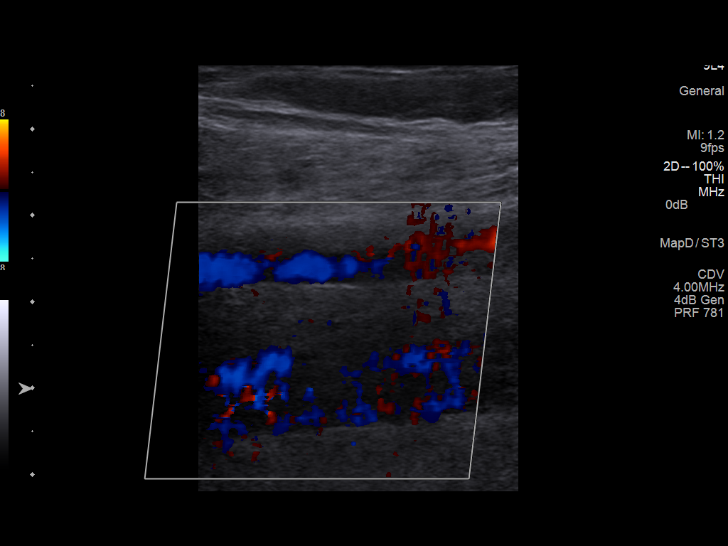
[im 37/37]
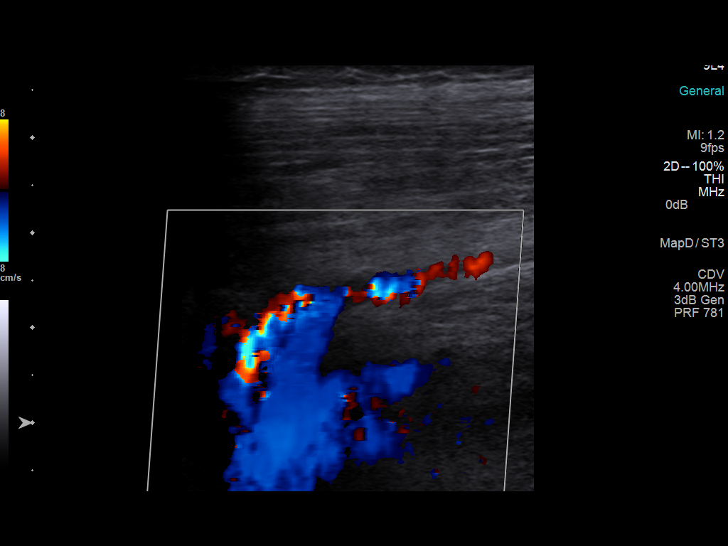

[14 of 24 positions shown; findings below may reference images not displayed]

FINDINGS: Normal compressibility of the common femoral, superficial femoral,
and popliteal veins, as well as the proximal calf veins. No filling
defects to suggest DVT on grayscale or color Doppler imaging.
Doppler waveforms show normal direction of venous flow, normal
respiratory phasicity and response to augmentation. Prominent left
inguinal lymph node measured 15 mm short axis diameter.
IMPRESSION: 1. No evidence of  lower extremity deep vein thrombosis.
2. Left inguinal adenopathy, possibly reactive but nonspecific .

## 2015-05-22 IMAGING — CR DG TIBIA/FIBULA 2V*L*
2 series · 2 of 2 positions shown · non-contrast
Comparison: None.

CLINICAL DATA: Pain.  Cutaneous manifestations.  Previous surgery.

EXAM:
LEFT TIBIA AND FIBULA - 2 VIEW

[view not recorded (1 of 2)]
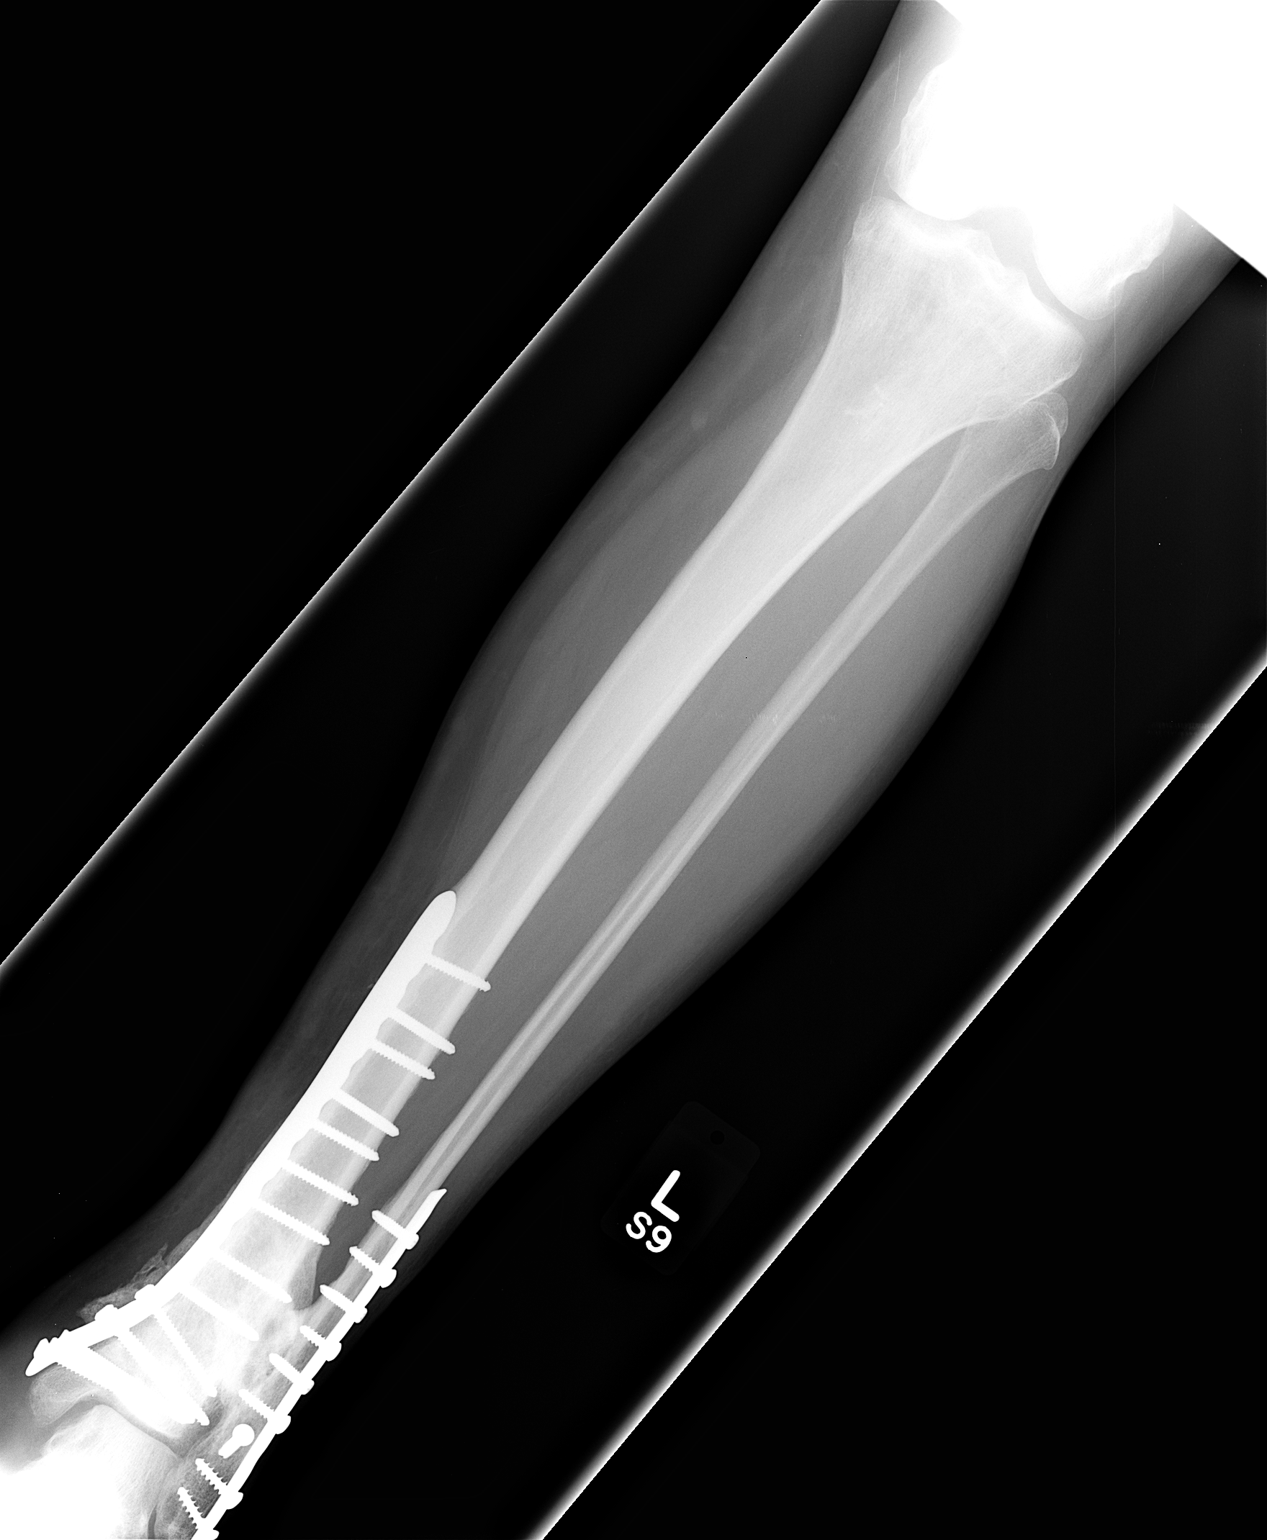

[view not recorded (2 of 2)]
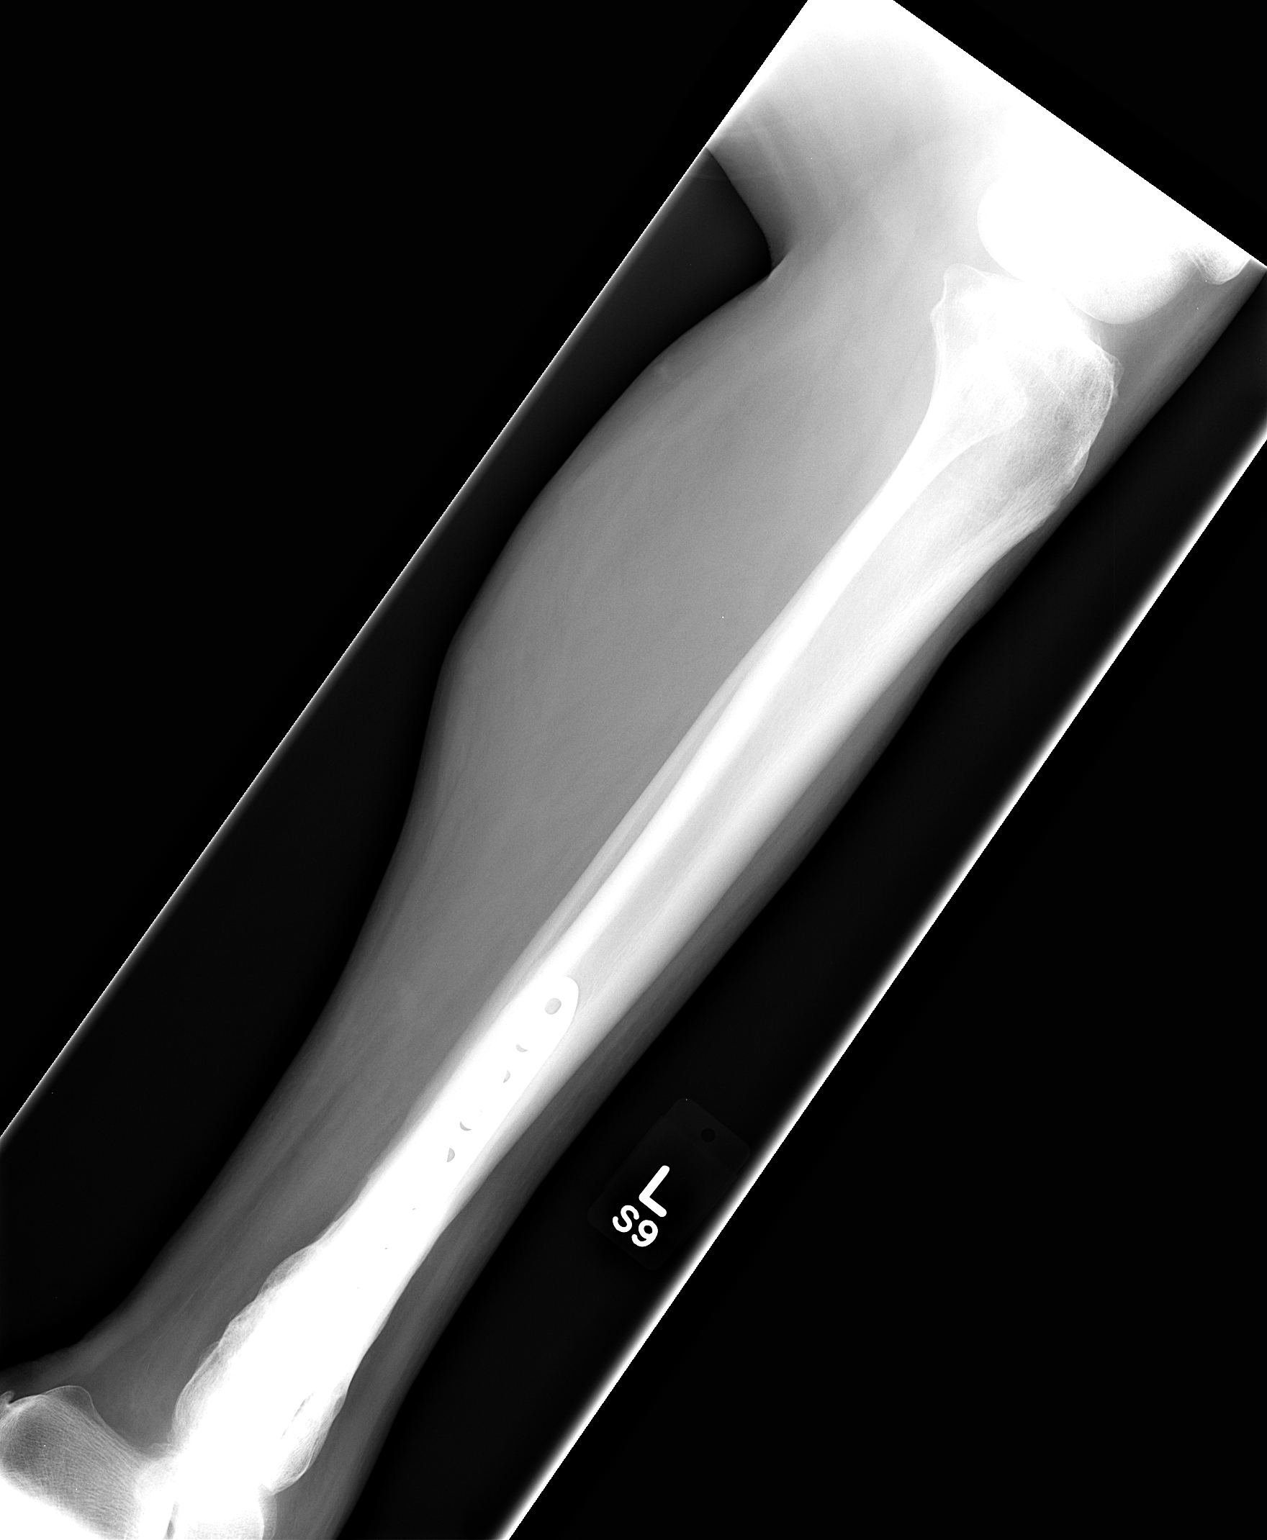

[2 of 2 positions shown; findings below may reference images not displayed]

FINDINGS: The patient is status post open reduction internal fixation of
distal tibia and fibular fractures. No evidence for nonunion. Slight
loosening of several screws. Hypertrophic change surrounds the outer
margin of the tibial plane. Moderate soft tissue swelling is noted
medially. Infection not excluded.
IMPRESSION: Moderate medial soft tissue swelling. No acute osseous findings. No
visible nonunion or osteomyelitis.

## 2021-07-26 ENCOUNTER — Encounter: Payer: Self-pay | Admitting: *Deleted

## 2021-10-01 ENCOUNTER — Encounter: Payer: Medicaid Other | Admitting: Vascular Surgery

## 2021-10-18 NOTE — Progress Notes (Deleted)
[facility] [place]  ASSESSMENT / PLAN: Katie Young is a 45 y.o. female with atherosclerosis of *** native arteries of *** causing {Chronic PAD levels:25303}.  Patient counseled {pad risk2:26283}  WIfI score calculated based on clinical exam and non-invasive measurements. {WIFIvascular:26096}  Recommend the following which can slow the progression of atherosclerosis and reduce the risk of major adverse cardiac / limb events:  Complete cessation from all tobacco products. Blood glucose control with goal A1c < 7%. Blood pressure control with goal blood pressure < 140/90 mmHg. Lipid reduction therapy with goal LDL-C <100 mg/dL (<16 if symptomatic from PAD).  Aspirin 81mg  PO QD.  *** Clopidogrel 75mg  PO QD. *** Rivaroxaban 2.5mg  PO BID. *** Cilostozal 100mg  PO BID for intermittent claudication without evidence of heart failure. Atorvastatin 40-80mg  PO QD (or other "high intensity" statin therapy). *** Daily walking to and past the point of discomfort. Patient counseled to keep a log of exercise distance. *** Adequate hydration (at least 2 liters / day) if patient's heart and kidney function is adequate.  Plan *** lower extremity angiogram with possible intervention via *** approach in cath lab ***.    CHIEF COMPLAINT: ***  HISTORY OF PRESENT ILLNESS: Katie Young is a 45 y.o. female ***  VASCULAR SURGICAL HISTORY: ***  VASCULAR RISK FACTORS: {FINDINGS; POSITIVE [phone]} history of stroke / transient ischemic attack. {FINDINGS; POSITIVE [phone]} history of coronary artery disease. *** history of PCI. *** history of CABG.  {FINDINGS; POSITIVE [phone]} history of diabetes mellitus. Last A1c ***. {FINDINGS; POSITIVE [phone]} history of smoking. *** actively smoking. {FINDINGS; POSITIVE [phone]} history of hypertension. *** drug regimen with *** control. {FINDINGS; POSITIVE [phone]}  history of chronic kidney disease.  Last GFR ***. CKD {stage:30421363}. {FINDINGS; POSITIVE [phone]} history of chronic obstructive pulmonary disease, treated with ***.  FUNCTIONAL STATUS: ECOG performance status: {findings; ecog performance status:31780} Ambulatory status: {TNHAmbulation:25868}  Past Medical History:  Diagnosis Date   Lower leg fracture     Past Surgical History:  Procedure Laterality Date   FRACTURE SURGERY     TUBAL LIGATION      No family history on file.  Social History   Socioeconomic History   Marital status: Divorced    Spouse name: Not on file   Number of children: Not on file   Years of education: Not on file   Highest education level: Not on file  Occupational History   Not on file  Tobacco Use   Smoking status: Every Day    Types: Cigarettes   Smokeless tobacco: Not on file  Substance and Sexual Activity   Alcohol use: Yes    Comment: weekend use   Drug use: No   Sexual activity: Not on file  Other Topics Concern   Not on file  Social History Narrative   Not on file   Social Determinants of Health   Financial Resource Strain: Not on file  Food Insecurity: Not on file  Transportation Needs: Not on file  Physical Activity: Not on file  Stress: Not on file  Social Connections: Not on file  Intimate Partner Violence: Not on file    Allergies  Allergen Reactions   Sulfa Antibiotics Nausea And Vomiting    Current Outpatient Medications  Medication Sig Dispense Refill   cephALEXin (KEFLEX) 500 MG capsule Take 1 capsule (500 mg total) by mouth 4 (four) times daily. 40 capsule 0   doxycycline (VIBRAMYCIN) 100 MG capsule Take 1 capsule (100 mg total) by mouth 2 (  two) times daily. 20 capsule 0   naproxen sodium (ALEVE) 220 MG tablet Take 220 mg by mouth once as needed (for pain).     No current facility-administered medications for this visit.    REVIEW OF SYSTEMS:  [X]  denotes positive finding, [ ]  denotes negative  finding Cardiac  Comments:  Chest pain or chest pressure: ***   Shortness of breath upon exertion:    Short of breath when lying flat:    Irregular heart rhythm:        Vascular    Pain in calf, thigh, or hip brought on by ambulation:    Pain in feet at night that wakes you up from your sleep:     Blood clot in your veins:    Leg swelling:         Pulmonary    Oxygen at home:    Productive cough:     Wheezing:         Neurologic    Sudden weakness in arms or legs:     Sudden numbness in arms or legs:     Sudden onset of difficulty speaking or slurred speech:    Temporary loss of vision in one eye:     Problems with dizziness:         Gastrointestinal    Blood in stool:     Vomited blood:         Genitourinary    Burning when urinating:     Blood in urine:        Psychiatric    Major depression:         Hematologic    Bleeding problems:    Problems with blood clotting too easily:        Skin    Rashes or ulcers:        Constitutional    Fever or chills:      PHYSICAL EXAM There were no vitals filed for this visit.  Constitutional: *** appearing. *** distress. Appears *** nourished.  Neurologic: CN ***. *** focal findings. *** sensory loss. Psychiatric: *** Mood and affect symmetric and appropriate. Eyes: *** No icterus. No conjunctival pallor. Ears, nose, throat: *** mucous membranes moist. Midline trachea.  Cardiac: *** rate and rhythm.  Respiratory: *** unlabored. Abdominal: *** soft, non-tender, non-distended.  Peripheral vascular: *** Extremity: *** edema. *** cyanosis. *** pallor.  Skin: *** gangrene. *** ulceration.  Lymphatic: *** Stemmer's sign. *** palpable lymphadenopathy.  PERTINENT LABORATORY AND RADIOLOGIC DATA  Most recent CBC CBC Latest Ref Rng & Units 04/15/2014  WBC 4.0 - 10.5 K/uL 8.2  Hemoglobin 12.0 - 15.0 g/dL  Hematocrit 04/17/2014 - 46.0 % 42.6  Platelets 150 - 400 K/uL 200     Most recent CMP CMP Latest Ref Rng & Units  04/15/2014 06/01/2009  Glucose 70 - 99 mg/dL 04/17/2014) 08/01/2009  BUN 6 - 23 mg/dL 7 -  Creatinine 644(I - 1.10 mg/dL 347 -  Sodium 4.25 - 9.56 mEq/L 140 -  Potassium 3.7 - 5.3 mEq/L 3.8 -  Chloride 96 - 112 mEq/L 101 -  CO2 19 - 32 mEq/L 27 -  Calcium 8.4 - 10.5 mg/dL 9.0 -  Total Protein 6.0 - 8.3 g/dL 7.2 -  Total Bilirubin 0.3 - 1.2 mg/dL 0.4 -  Alkaline Phos 39 - 117 U/L 72 -  AST 0 - 37 U/L 12 -  ALT 0 - 35 U/L 9 -    Renal function CrCl cannot be calculated (Patient's most  recent lab result is older than the maximum 21 days allowed.).  Hgb A1c MFr Bld (%)  Date Value  06/01/2009 5.3    No results found for: LDLCALC, LDLC, HIRISKLDL, POCLDL, LDLDIRECT, REALLDLC, TOTLDLC   Vascular Imaging: ***  Kayshawn Ozburn N. Lenell Antu, MD [facility] Phone Number: [phone] 10/18/2021 2:38 PM  Total time spent on preparing this encounter including chart review, data review, collecting history, examining the patient, coordinating care for this {tnhtimebilling:26202}  Portions of this report may have been transcribed using voice recognition software.  Every effort has been made to ensure accuracy; however, inadvertent computerized transcription errors may still be present.

## 2021-10-19 ENCOUNTER — Encounter: Payer: Medicaid Other | Admitting: Vascular Surgery

## 2021-11-01 ENCOUNTER — Encounter: Payer: Medicaid Other | Admitting: Surgery

## 2021-11-16 ENCOUNTER — Encounter: Payer: Medicaid Other | Admitting: Vascular Surgery

## 2021-12-06 NOTE — Progress Notes (Signed)
[facility] [place]  ASSESSMENT / PLAN: Katie Young is a 45 y.o. female with atherosclerosis of native arteries of left lower extremity causing ulceration. I suspect she has infected hardware in her ankle. She has no palpable pedal pulses and multiple risk factors for PAD. Her ABI is normal, but this may be falsely elevated.  Patient counseled patients with chronic limb threatening ischemia have an annual risk of cardiovascular mortality of 25% and a high risk of amputation.   Recommend the following which can slow the progression of atherosclerosis and reduce the risk of major adverse cardiac / limb events:  Complete cessation from all tobacco products. Blood glucose control with goal A1c < 7%. Blood pressure control with goal blood pressure < 140/90 mmHg. Lipid reduction therapy with goal LDL-C <100 mg/dL (<70 if symptomatic from PAD).  Aspirin 81mg  PO QD.  Atorvastatin 40-80mg  PO QD (or other "high intensity" statin therapy).  Plan left lower extremity angiogram with possible intervention via right common femoral artery approach in cath lab 12/10/21.  CHIEF COMPLAINT: Chronic wound of left leg  HISTORY OF PRESENT ILLNESS: Katie Young is a 45 y.o. female to clinic for evaluation of chronic left ankle wound.  The patient had open reduction internal fixation of her left ankle in 2013.  She developed infection and had incision and drainage in January 2014.  She had a chronic wound since that time.  She has not seen an orthopedist since 2015 when she saw Dr. Aline Brochure.  Patient is chronically ill, and currently in a nursing home for respiratory failure.  She is on chronic oxygen therapy on 2 L at all times.  She has not smoked since entering her nursing facility, but was previously a heavy smoker and drinker.  VASCULAR SURGICAL HISTORY: None  VASCULAR RISK FACTORS: Negative history of stroke / transient ischemic attack. Negative history of coronary artery  disease.  Positive history of diabetes mellitus. Last A1c known, but patient reports it is below 6. Positive history of smoking. Not actively smoking. Negative history of hypertension.  Positive history of chronic kidney disease.  Patient unsure of GFR. Positive history of chronic obstructive pulmonary disease, on home oxygen.  FUNCTIONAL STATUS: ECOG performance status: (3) Capable of limited self-care, confined to bed or chair > 50% of waking hours Ambulatory status: Ambulatory with assistance only  Past Medical History:  Diagnosis Date   Chronic kidney disease    COPD (chronic obstructive pulmonary disease) (HCC)    Lower leg fracture     Past Surgical History:  Procedure Laterality Date   FRACTURE SURGERY     TUBAL LIGATION      History reviewed. No pertinent family history.  Social History   Socioeconomic History   Marital status: Divorced    Spouse name: Not on file   Number of children: Not on file   Years of education: Not on file   Highest education level: Not on file  Occupational History   Not on file  Tobacco Use   Smoking status: Former    Packs/day: 1.00    Types: Cigarettes   Smokeless tobacco: Not on file  Vaping Use   Vaping Use: Never used  Substance and Sexual Activity   Alcohol use: Yes    Comment: weekend use   Drug use: No   Sexual activity: Not on file  Other Topics Concern   Not on file  Social History Narrative   Not on file   Social Determinants of  Health   Financial Resource Strain: Not on file  Food Insecurity: Not on file  Transportation Needs: Not on file  Physical Activity: Not on file  Stress: Not on file  Social Connections: Not on file  Intimate Partner Violence: Not on file    Allergies  Allergen Reactions   Sulfa Antibiotics Nausea And Vomiting    Current Outpatient Medications  Medication Sig Dispense Refill   busPIRone (BUSPAR) 10 MG tablet Take 10 mg by mouth 3 (three) times daily.     Docusate Sodium (DSS)  100 MG CAPS Take by mouth.     folic acid (FOLVITE) 1 MG tablet Take 1 mg by mouth daily.     gabapentin (NEURONTIN) 400 MG capsule Take 400 mg by mouth 3 (three) times daily.     hydrOXYzine (ATARAX) 50 MG tablet Take by mouth.     ipratropium-albuterol (DUONEB) 0.5-2.5 (3) MG/3ML SOLN Take by nebulization.     naproxen sodium (ALEVE) 220 MG tablet Take 220 mg by mouth once as needed (for pain).     pantoprazole (PROTONIX) 40 MG tablet Take 40 mg by mouth daily.     sevelamer carbonate (RENVELA) 800 MG tablet Take 800 mg by mouth 3 (three) times daily.     traZODone (DESYREL) 50 MG tablet Take 50 mg by mouth at bedtime.     No current facility-administered medications for this visit.    REVIEW OF SYSTEMS:  [X]  denotes positive finding, [ ]  denotes negative finding Cardiac  Comments:  Chest pain or chest pressure:    Shortness of breath upon exertion:    Short of breath when lying flat:    Irregular heart rhythm:        Vascular    Pain in calf, thigh, or hip brought on by ambulation:    Pain in feet at night that wakes you up from your sleep:     Blood clot in your veins:    Leg swelling:         Pulmonary    Oxygen at home: x   Productive cough:     Wheezing:         Neurologic    Sudden weakness in arms or legs:     Sudden numbness in arms or legs:     Sudden onset of difficulty speaking or slurred speech:    Temporary loss of vision in one eye:     Problems with dizziness:         Gastrointestinal    Blood in stool:     Vomited blood:         Genitourinary    Burning when urinating:     Blood in urine:        Psychiatric    Major depression:         Hematologic    Bleeding problems:    Problems with blood clotting too easily:        Skin    Rashes or ulcers:        Constitutional    Fever or chills:      PHYSICAL EXAM Vitals:   12/07/21 0928  BP: 124/77  Pulse: 98  Resp: 20  Temp: 97.9 F (36.6 C)  SpO2: 97%  Weight: 160 lb (72.6 kg)  Height:  5\' 9"  (1.753 m)    Constitutional: Chronically ill, appearing older than stated age. No distress. Appears marginally nourished.  Neurologic: CN intact. no focal findings. no sensory loss. Psychiatric:  Mood and  affect symmetric and appropriate. Eyes:  No icterus. No conjunctival pallor. Ears, nose, throat: poor dentition. mucous membranes moist. Midline trachea.  Cardiac: regular rate and rhythm.  Respiratory:  unlabored. Abdominal:  soft, non-tender, non-distended.  Peripheral vascular: no palpable pedal pulses. No palpable popliteal pulses. Extremity: No edema. No cyanosis. No pallor.  Skin: left leg chronic ulcer wound with malodor. No gangrene.   Lymphatic: no Stemmer's sign. no palpable lymphadenopathy.  PERTINENT LABORATORY AND RADIOLOGIC DATA  Most recent CBC CBC Latest Ref Rng & Units 04/15/2014  WBC 4.0 - 10.5 K/uL 8.2  Hemoglobin 12.0 - 15.0 g/dL 14.1  Hematocrit 36.0 - 46.0 % 42.6  Platelets 150 - 400 K/uL 200     Most recent CMP CMP Latest Ref Rng & Units 04/15/2014 06/01/2009  Glucose 70 - 99 mg/dL 101(H) 105  BUN 6 - 23 mg/dL 7 -  Creatinine 0.50 - 1.10 mg/dL 0.66 -  Sodium 137 - 147 mEq/L 140 -  Potassium 3.7 - 5.3 mEq/L 3.8 -  Chloride 96 - 112 mEq/L 101 -  CO2 19 - 32 mEq/L 27 -  Calcium 8.4 - 10.5 mg/dL 9.0 -  Total Protein 6.0 - 8.3 g/dL 7.2 -  Total Bilirubin 0.3 - 1.2 mg/dL 0.4 -  Alkaline Phos 39 - 117 U/L 72 -  AST 0 - 37 U/L 12 -  ALT 0 - 35 U/L 9 -    Renal function CrCl cannot be calculated (Patient's most recent lab result is older than the maximum 21 days allowed.).  Hgb A1c MFr Bld (%)  Date Value  06/01/2009 5.3    No results found for: LDLCALC, LDLC, HIRISKLDL, POCLDL, LDLDIRECT, REALLDLC, TOTLDLC   Vascular Imaging: ABI ([facility]) R 0.99 L 0.88  XR - retained hardware in left ankle  Yevonne Aline. Stanford Breed, MD [facility] Phone Number: [phone] 12/07/2021 9:48 AM  Total time spent on  preparing this encounter including chart review, data review, collecting history, examining the patient, coordinating care for this new patient, 60 minutes.  Portions of this report may have been transcribed using voice recognition software.  Every effort has been made to ensure accuracy; however, inadvertent computerized transcription errors may still be present.

## 2021-12-06 NOTE — H&P (View-Only) (Signed)
[facility] [place]  ASSESSMENT / PLAN: Katie Young is a 45 y.o. female with atherosclerosis of native arteries of left lower extremity causing ulceration. I suspect she has infected hardware in her ankle. She has no palpable pedal pulses and multiple risk factors for PAD. Her ABI is normal, but this may be falsely elevated.  Patient counseled patients with chronic limb threatening ischemia have an annual risk of cardiovascular mortality of 25% and a high risk of amputation.   Recommend the following which can slow the progression of atherosclerosis and reduce the risk of major adverse cardiac / limb events:  Complete cessation from all tobacco products. Blood glucose control with goal A1c < 7%. Blood pressure control with goal blood pressure < 140/90 mmHg. Lipid reduction therapy with goal LDL-C <100 mg/dL (<06 if symptomatic from PAD).  Aspirin 81mg  PO QD.  Atorvastatin 40-80mg  PO QD (or other "high intensity" statin therapy).  Plan left lower extremity angiogram with possible intervention via right common femoral artery approach in cath lab 12/10/21.  CHIEF COMPLAINT: Chronic wound of left leg  HISTORY OF PRESENT ILLNESS: Katie Young is a 45 y.o. female to clinic for evaluation of chronic left ankle wound.  The patient had open reduction internal fixation of her left ankle in 2013.  She developed infection and had incision and drainage in January 2014.  She had a chronic wound since that time.  She has not seen an orthopedist since 2015 when she saw Dr. 2016.  Patient is chronically ill, and currently in a nursing home for respiratory failure.  She is on chronic oxygen therapy on 2 L at all times.  She has not smoked since entering her nursing facility, but was previously a heavy smoker and drinker.  VASCULAR SURGICAL HISTORY: None  VASCULAR RISK FACTORS: Negative history of stroke / transient ischemic attack. Negative history of coronary artery  disease.  Positive history of diabetes mellitus. Last A1c known, but patient reports it is below 6. Positive history of smoking. Not actively smoking. Negative history of hypertension.  Positive history of chronic kidney disease.  Patient unsure of GFR. Positive history of chronic obstructive pulmonary disease, on home oxygen.  FUNCTIONAL STATUS: ECOG performance status: (3) Capable of limited self-care, confined to bed or chair > 50% of waking hours Ambulatory status: Ambulatory with assistance only  Past Medical History:  Diagnosis Date   Chronic kidney disease    COPD (chronic obstructive pulmonary disease) (HCC)    Lower leg fracture     Past Surgical History:  Procedure Laterality Date   FRACTURE SURGERY     TUBAL LIGATION      History reviewed. No pertinent family history.  Social History   Socioeconomic History   Marital status: Divorced    Spouse name: Not on file   Number of children: Not on file   Years of education: Not on file   Highest education level: Not on file  Occupational History   Not on file  Tobacco Use   Smoking status: Former    Packs/day: 1.00    Types: Cigarettes   Smokeless tobacco: Not on file  Vaping Use   Vaping Use: Never used  Substance and Sexual Activity   Alcohol use: Yes    Comment: weekend use   Drug use: No   Sexual activity: Not on file  Other Topics Concern   Not on file  Social History Narrative   Not on file   Social Determinants of  Health   Financial Resource Strain: Not on file  Food Insecurity: Not on file  Transportation Needs: Not on file  Physical Activity: Not on file  Stress: Not on file  Social Connections: Not on file  Intimate Partner Violence: Not on file    Allergies  Allergen Reactions   Sulfa Antibiotics Nausea And Vomiting    Current Outpatient Medications  Medication Sig Dispense Refill   busPIRone (BUSPAR) 10 MG tablet Take 10 mg by mouth 3 (three) times daily.     Docusate Sodium (DSS)  100 MG CAPS Take by mouth.     folic acid (FOLVITE) 1 MG tablet Take 1 mg by mouth daily.     gabapentin (NEURONTIN) 400 MG capsule Take 400 mg by mouth 3 (three) times daily.     hydrOXYzine (ATARAX) 50 MG tablet Take by mouth.     ipratropium-albuterol (DUONEB) 0.5-2.5 (3) MG/3ML SOLN Take by nebulization.     naproxen sodium (ALEVE) 220 MG tablet Take 220 mg by mouth once as needed (for pain).     pantoprazole (PROTONIX) 40 MG tablet Take 40 mg by mouth daily.     sevelamer carbonate (RENVELA) 800 MG tablet Take 800 mg by mouth 3 (three) times daily.     traZODone (DESYREL) 50 MG tablet Take 50 mg by mouth at bedtime.     No current facility-administered medications for this visit.    REVIEW OF SYSTEMS:  [X]  denotes positive finding, [ ]  denotes negative finding Cardiac  Comments:  Chest pain or chest pressure:    Shortness of breath upon exertion:    Short of breath when lying flat:    Irregular heart rhythm:        Vascular    Pain in calf, thigh, or hip brought on by ambulation:    Pain in feet at night that wakes you up from your sleep:     Blood clot in your veins:    Leg swelling:         Pulmonary    Oxygen at home: x   Productive cough:     Wheezing:         Neurologic    Sudden weakness in arms or legs:     Sudden numbness in arms or legs:     Sudden onset of difficulty speaking or slurred speech:    Temporary loss of vision in one eye:     Problems with dizziness:         Gastrointestinal    Blood in stool:     Vomited blood:         Genitourinary    Burning when urinating:     Blood in urine:        Psychiatric    Major depression:         Hematologic    Bleeding problems:    Problems with blood clotting too easily:        Skin    Rashes or ulcers:        Constitutional    Fever or chills:      PHYSICAL EXAM Vitals:   12/07/21 0928  BP: 124/77  Pulse: 98  Resp: 20  Temp: 97.9 F (36.6 C)  SpO2: 97%  Weight: 160 lb (72.6 kg)  Height:  5\' 9"  (1.753 m)    Constitutional: Chronically ill, appearing older than stated age. No distress. Appears marginally nourished.  Neurologic: CN intact. no focal findings. no sensory loss. Psychiatric:  Mood and  affect symmetric and appropriate. Eyes:  No icterus. No conjunctival pallor. Ears, nose, throat: poor dentition. mucous membranes moist. Midline trachea.  Cardiac: regular rate and rhythm.  Respiratory:  unlabored. Abdominal:  soft, non-tender, non-distended.  Peripheral vascular: no palpable pedal pulses. No palpable popliteal pulses. Extremity: No edema. No cyanosis. No pallor.  Skin: left leg chronic ulcer wound with malodor. No gangrene.   Lymphatic: no Stemmer's sign. no palpable lymphadenopathy.  PERTINENT LABORATORY AND RADIOLOGIC DATA  Most recent CBC CBC Latest Ref Rng & Units 04/15/2014  WBC 4.0 - 10.5 K/uL 8.2  Hemoglobin 12.0 - 15.0 g/dL 14.1  Hematocrit 36.0 - 46.0 % 42.6  Platelets 150 - 400 K/uL 200     Most recent CMP CMP Latest Ref Rng & Units 04/15/2014 06/01/2009  Glucose 70 - 99 mg/dL 101(H) 105  BUN 6 - 23 mg/dL 7 -  Creatinine 0.50 - 1.10 mg/dL 0.66 -  Sodium 137 - 147 mEq/L 140 -  Potassium 3.7 - 5.3 mEq/L 3.8 -  Chloride 96 - 112 mEq/L 101 -  CO2 19 - 32 mEq/L 27 -  Calcium 8.4 - 10.5 mg/dL 9.0 -  Total Protein 6.0 - 8.3 g/dL 7.2 -  Total Bilirubin 0.3 - 1.2 mg/dL 0.4 -  Alkaline Phos 39 - 117 U/L 72 -  AST 0 - 37 U/L 12 -  ALT 0 - 35 U/L 9 -    Renal function CrCl cannot be calculated (Patient's most recent lab result is older than the maximum 21 days allowed.).  Hgb A1c MFr Bld (%)  Date Value  06/01/2009 5.3    No results found for: LDLCALC, LDLC, HIRISKLDL, POCLDL, LDLDIRECT, REALLDLC, TOTLDLC   Vascular Imaging: ABI ([facility]) R 0.99 L 0.88  XR - retained hardware in left ankle  Yevonne Aline. Stanford Breed, MD [facility] Phone Number: [phone] 12/07/2021 9:48 AM  Total time spent on  preparing this encounter including chart review, data review, collecting history, examining the patient, coordinating care for this new patient, 60 minutes.  Portions of this report may have been transcribed using voice recognition software.  Every effort has been made to ensure accuracy; however, inadvertent computerized transcription errors may still be present.

## 2021-12-07 ENCOUNTER — Ambulatory Visit (INDEPENDENT_AMBULATORY_CARE_PROVIDER_SITE_OTHER): Payer: Medicaid Other | Admitting: Vascular Surgery

## 2021-12-07 ENCOUNTER — Encounter: Payer: Self-pay | Admitting: Vascular Surgery

## 2021-12-07 ENCOUNTER — Other Ambulatory Visit: Payer: Self-pay

## 2021-12-07 VITALS — BP 124/77 | HR 98 | Temp 97.9°F | Resp 20 | Ht 69.0 in | Wt 160.0 lb

## 2021-12-07 DIAGNOSIS — I70243 Atherosclerosis of native arteries of left leg with ulceration of ankle: Secondary | ICD-10-CM

## 2021-12-09 ENCOUNTER — Encounter (INDEPENDENT_AMBULATORY_CARE_PROVIDER_SITE_OTHER): Payer: Self-pay | Admitting: *Deleted

## 2021-12-10 ENCOUNTER — Ambulatory Visit (HOSPITAL_COMMUNITY): Admission: RE | Admit: 2021-12-10 | Payer: Medicaid Other | Source: Home / Self Care | Admitting: Vascular Surgery

## 2021-12-10 ENCOUNTER — Encounter (HOSPITAL_COMMUNITY): Admission: RE | Payer: Self-pay | Source: Home / Self Care

## 2021-12-10 SURGERY — ABDOMINAL AORTOGRAM W/LOWER EXTREMITY
Anesthesia: LOCAL

## 2021-12-10 NOTE — Progress Notes (Signed)
Called patient to see if she could come in early for her procedure.  She wasn't aware procedure for scheduled for today.  She also said she tested + for Covid on Wednesday 12/14.  Notified Dr Lenell Antu, will reschedule procedure.  Sent office message to reschedule.

## 2021-12-21 ENCOUNTER — Other Ambulatory Visit: Payer: Self-pay

## 2021-12-24 ENCOUNTER — Ambulatory Visit (HOSPITAL_COMMUNITY)
Admission: RE | Admit: 2021-12-24 | Discharge: 2021-12-24 | Disposition: A | Discharge status: Home / Self Care | Payer: Medicaid Other | Attending: Vascular Surgery | Admitting: Vascular Surgery

## 2021-12-24 ENCOUNTER — Other Ambulatory Visit: Payer: Self-pay

## 2021-12-24 ENCOUNTER — Encounter (HOSPITAL_COMMUNITY): Payer: Self-pay

## 2021-12-24 ENCOUNTER — Other Ambulatory Visit (HOSPITAL_COMMUNITY): Payer: Self-pay

## 2021-12-24 ENCOUNTER — Ambulatory Visit (HOSPITAL_COMMUNITY): Admit: 2021-12-24 | Payer: Medicaid Other | Admitting: Cardiovascular Disease

## 2021-12-24 ENCOUNTER — Encounter (HOSPITAL_COMMUNITY)
Admission: RE | Disposition: A | Discharge status: Home / Self Care | Payer: Self-pay | Source: Home / Self Care | Attending: Vascular Surgery

## 2021-12-24 DIAGNOSIS — L97329 Non-pressure chronic ulcer of left ankle with unspecified severity: Secondary | ICD-10-CM | POA: Insufficient documentation

## 2021-12-24 DIAGNOSIS — J449 Chronic obstructive pulmonary disease, unspecified: Secondary | ICD-10-CM | POA: Diagnosis not present

## 2021-12-24 DIAGNOSIS — Z87891 Personal history of nicotine dependence: Secondary | ICD-10-CM | POA: Insufficient documentation

## 2021-12-24 DIAGNOSIS — Z79899 Other long term (current) drug therapy: Secondary | ICD-10-CM | POA: Diagnosis not present

## 2021-12-24 DIAGNOSIS — N189 Chronic kidney disease, unspecified: Secondary | ICD-10-CM | POA: Insufficient documentation

## 2021-12-24 DIAGNOSIS — I70243 Atherosclerosis of native arteries of left leg with ulceration of ankle: Secondary | ICD-10-CM | POA: Insufficient documentation

## 2021-12-24 DIAGNOSIS — I739 Peripheral vascular disease, unspecified: Secondary | ICD-10-CM

## 2021-12-24 DIAGNOSIS — Z7982 Long term (current) use of aspirin: Secondary | ICD-10-CM | POA: Diagnosis not present

## 2021-12-24 DIAGNOSIS — E1151 Type 2 diabetes mellitus with diabetic peripheral angiopathy without gangrene: Secondary | ICD-10-CM | POA: Insufficient documentation

## 2021-12-24 DIAGNOSIS — Z9981 Dependence on supplemental oxygen: Secondary | ICD-10-CM | POA: Insufficient documentation

## 2021-12-24 DIAGNOSIS — E1122 Type 2 diabetes mellitus with diabetic chronic kidney disease: Secondary | ICD-10-CM | POA: Insufficient documentation

## 2021-12-24 HISTORY — PX: ABDOMINAL AORTOGRAM W/LOWER EXTREMITY: CATH118223

## 2021-12-24 LAB — POCT I-STAT, CHEM 8
BUN: 9 mg/dL (ref 6–20)
Calcium, Ion: 1.23 mmol/L (ref 1.15–1.40)
Chloride: 101 mmol/L (ref 98–111)
Creatinine, Ser: 0.5 mg/dL (ref 0.44–1.00)
Glucose, Bld: 94 mg/dL (ref 70–99)
HCT: 36 % (ref 36.0–46.0)
Hemoglobin: 12.2 g/dL (ref 12.0–15.0)
Potassium: 3.4 mmol/L — ABNORMAL LOW (ref 3.5–5.1)
Sodium: 139 mmol/L (ref 135–145)
TCO2: 28 mmol/L (ref 22–32)

## 2021-12-24 SURGERY — ABDOMINAL AORTOGRAM W/LOWER EXTREMITY
Anesthesia: LOCAL

## 2021-12-24 SURGERY — LEFT HEART CATH AND CORONARY ANGIOGRAPHY
Anesthesia: LOCAL

## 2021-12-24 MED ORDER — SODIUM CHLORIDE 0.9% FLUSH: 3.0000 mL | INTRAVENOUS | Status: DC | PRN

## 2021-12-24 MED ORDER — SODIUM CHLORIDE 0.9% FLUSH: 3.0000 mL | Freq: Two times a day (BID) | INTRAVENOUS | Status: DC

## 2021-12-24 MED ORDER — ATORVASTATIN CALCIUM 10 MG PO TABS
10.0000 mg | ORAL_TABLET | Freq: Every day | ORAL | 11 refills | Status: DC
  Filled 2021-12-24: qty 30, 30d supply, fill #0

## 2021-12-24 MED ORDER — ACETAMINOPHEN 325 MG PO TABS: 650.0000 mg | ORAL_TABLET | ORAL | Status: DC | PRN

## 2021-12-24 MED ORDER — SODIUM CHLORIDE 0.9 % WEIGHT BASED INFUSION: 1.0000 mL/kg/h | INTRAVENOUS | Status: DC

## 2021-12-24 MED ORDER — LIDOCAINE HCL (PF) 1 % IJ SOLN
INTRAMUSCULAR | Status: DC | PRN
  Administered 2021-12-24: 12 mL

## 2021-12-24 MED ORDER — SODIUM CHLORIDE 0.9 % IV SOLN: INTRAVENOUS | Status: DC

## 2021-12-24 MED ORDER — SODIUM CHLORIDE 0.9 % IV SOLN: 250.0000 mL | INTRAVENOUS | Status: DC | PRN

## 2021-12-24 MED ORDER — MIDAZOLAM HCL 2 MG/2ML IJ SOLN
INTRAMUSCULAR | Status: AC
  Filled 2021-12-24: qty 2

## 2021-12-24 MED ORDER — FENTANYL CITRATE (PF) 100 MCG/2ML IJ SOLN
INTRAMUSCULAR | Status: AC
  Filled 2021-12-24: qty 2

## 2021-12-24 MED ORDER — ASPIRIN 81 MG PO TBEC
81.0000 mg | DELAYED_RELEASE_TABLET | Freq: Every day | ORAL | 2 refills | Status: DC
  Filled 2021-12-24: qty 30, 30d supply, fill #0

## 2021-12-24 MED ORDER — LABETALOL HCL 5 MG/ML IV SOLN: 10.0000 mg | INTRAVENOUS | Status: DC | PRN

## 2021-12-24 MED ORDER — HEPARIN SODIUM (PORCINE) 1000 UNIT/ML IJ SOLN
INTRAMUSCULAR | Status: AC
  Filled 2021-12-24: qty 10

## 2021-12-24 MED ORDER — ASPIRIN EC 81 MG PO TBEC
81.0000 mg | DELAYED_RELEASE_TABLET | Freq: Every day | ORAL | Status: DC
  Administered 2021-12-24: 16:00:00 81 mg via ORAL
  Filled 2021-12-24: qty 1

## 2021-12-24 MED ORDER — FENTANYL CITRATE (PF) 100 MCG/2ML IJ SOLN
INTRAMUSCULAR | Status: DC | PRN
  Administered 2021-12-24: 50 ug via INTRAVENOUS
  Administered 2021-12-24: 25 ug via INTRAVENOUS

## 2021-12-24 MED ORDER — MIDAZOLAM HCL 2 MG/2ML IJ SOLN
INTRAMUSCULAR | Status: DC | PRN
  Administered 2021-12-24 (×2): 1 mg via INTRAVENOUS

## 2021-12-24 MED ORDER — HEPARIN (PORCINE) IN NACL 1000-0.9 UT/500ML-% IV SOLN
INTRAVENOUS | Status: DC | PRN
  Administered 2021-12-24 (×2): 500 mL

## 2021-12-24 MED ORDER — LIDOCAINE HCL (PF) 1 % IJ SOLN
INTRAMUSCULAR | Status: AC
  Filled 2021-12-24: qty 30

## 2021-12-24 MED ORDER — ATORVASTATIN CALCIUM 10 MG PO TABS
10.0000 mg | ORAL_TABLET | Freq: Every day | ORAL | Status: DC
  Administered 2021-12-24: 16:00:00 10 mg via ORAL
  Filled 2021-12-24: qty 1

## 2021-12-24 MED ORDER — ONDANSETRON HCL 4 MG/2ML IJ SOLN: 4.0000 mg | Freq: Four times a day (QID) | INTRAMUSCULAR | Status: DC | PRN

## 2021-12-24 MED ORDER — HYDRALAZINE HCL 20 MG/ML IJ SOLN: 5.0000 mg | INTRAMUSCULAR | Status: DC | PRN

## 2021-12-24 SURGICAL SUPPLY — 9 items
CATH OMNI FLUSH 5F 65CM (CATHETERS) ×1 IMPLANT
KIT MICROPUNCTURE NIT STIFF (SHEATH) ×1 IMPLANT
KIT PV (KITS) ×2 IMPLANT
SHEATH PINNACLE 5F 10CM (SHEATH) ×1 IMPLANT
SHEATH PROBE COVER 6X72 (BAG) ×1 IMPLANT
SYR MEDRAD MARK V 150ML (SYRINGE) ×1 IMPLANT
TRANSDUCER W/STOPCOCK (MISCELLANEOUS) ×2 IMPLANT
TRAY PV CATH (CUSTOM PROCEDURE TRAY) ×2 IMPLANT
WIRE BENTSON .035X145CM (WIRE) ×1 IMPLANT

## 2021-12-24 NOTE — Interval H&P Note (Signed)
History and Physical Interval Note:  12/24/2021 9:22 AM  Katie Young  has presented today for surgery, with the diagnosis of pad.  The various methods of treatment have been discussed with the patient and family. After consideration of risks, benefits and other options for treatment, the patient has consented to  Procedure(s): ABDOMINAL AORTOGRAM W/LOWER EXTREMITY (N/A) as a surgical intervention.  The patient's history has been reviewed, patient examined, no change in status, stable for surgery.  I have reviewed the patient's chart and labs.  Questions were answered to the patient's satisfaction.     Leonie Douglas

## 2021-12-24 NOTE — Progress Notes (Signed)
Site area- right  Site Prior to Removal- 0   Pressure Applied For-  15 MInutes   Bedrest Beginning at - 1530   Manual- Yes   Patient Status During Pull- Stable    Post Pull Groin Site- 0   Post Pull Instructions Given- Yes   Post Pull Pulses Present- Yes    Dressing Applied- Tegaderm and Gauze Dressing    Comments:

## 2021-12-24 NOTE — Op Note (Signed)
DATE OF SERVICE: 12/24/2021  PATIENT:  Katie Young  45 y.o. female  PRE-OPERATIVE DIAGNOSIS:  Atherosclerosis of native arteries of left lower extremity causing ulceration, possible infected orthopedic hardware.  POST-OPERATIVE DIAGNOSIS:  same  PROCEDURE:   1) Ultrasound guided right common femoral access 2) Catheter in aorta 3) Aortogram with bilateral lower extremity runoff angiogram (20mL total contrast) 4) Conscious sedation (14 minutes)  SURGEON:  Rande Brunt. Lenell Antu, MD  ASSISTANT: none  ANESTHESIA:   local and IV sedation  ESTIMATED BLOOD LOSS: minimal  LOCAL MEDICATIONS USED:  LIDOCAINE   COUNTS: confirmed correct.  PATIENT DISPOSITION:  PACU - hemodynamically stable.   Delay start of Pharmacological VTE agent (>24hrs) due to surgical blood loss or risk of bleeding: no  INDICATION FOR PROCEDURE: Katie Young is a 45 y.o. female with chronic ulceration about the left ankle. She has a history of ORIF of the left ankle. She has no palpable pedal pulse, but fairly normal ABIs. She has multiple risk factors for peripheral arterial disease. After careful discussion of risks, benefits, and alternatives the patient was offered angiography. The patient understood and wished to proceed.  OPERATIVE FINDINGS:  Terminal aorta and iliac arteries: Patent without flow limiting stenosis  Right lower extremity: Common femoral artery: Patent without flow limiting stenosis  Profunda femoris artery: Patent without flow limiting stenosis  Superficial femoral artery: Patent without flow limiting stenosis Popliteal artery: Patent without flow limiting stenosis Anterior tibial artery: Patent without flow limiting stenosis Tibioperoneal trunk: Patent without flow limiting stenosis Peroneal artery: Patent without flow limiting stenosis Posterior tibial artery: Patent without flow limiting stenosis Pedal circulation: poorly visualized  Left lower extremity: Common femoral artery:  Patent without flow limiting stenosis  Profunda femoris artery: Patent without flow limiting stenosis  Superficial femoral artery: Patent without flow limiting stenosis Popliteal artery: Patent without flow limiting stenosis Anterior tibial artery: Patent without flow limiting stenosis Tibioperoneal trunk: Patent without flow limiting stenosis Peroneal artery: Patent without flow limiting stenosis Posterior tibial artery: Patent without flow limiting stenosis Pedal circulation: poorly visualized  DESCRIPTION OF PROCEDURE: After identification of the patient in the pre-operative holding area, the patient was transferred to the operating room. The patient was positioned supine on the operating room table. Anesthesia was induced. The groins was prepped and draped in standard fashion. A surgical pause was performed confirming correct patient, procedure, and operative location.  The right groin was anesthetized with subcutaneous injection of 1% lidocaine. Using ultrasound guidance, the right common femoral artery was accessed with micropuncture technique. Fluoroscopy was used to confirm cannulation over the femoral head. The 57F sheath was upsized to 15F.   A Benson wire was advanced into the distal aorta. Over the wire an omni flush catheter was advanced to the level of L2. Aortogram was performed - see above for details. Bilateral lower extremity runoff angiography was performed - see above for details.  The sheath was left in place to be removed in the recovery area.  Conscious sedation was administered with the use of IV fentanyl and midazolam under continuous physician and nurse monitoring.  Heart rate, blood pressure, and oxygen saturation were continuously monitored.  Total sedation time was 14 minutes  Upon completion of the case instrument and sharps counts were confirmed correct. The patient was transferred to the PACU in good condition. I was present for all portions of the  procedure.  PLAN: Aspirin 81 mg by mouth indefinitely.  High intensity statin therapy by mouth indefinitely.  No evidence  of flow-limiting peripheral arterial disease.  Ulcer may be due to infected orthopedic hardware.  Will refer to orthopedics.  Follow-up with me as needed.  Rande Brunt. Lenell Antu, MD [facility] Phone Number: [phone] 12/24/2021 11:59 AM

## 2021-12-28 ENCOUNTER — Ambulatory Visit (INDEPENDENT_AMBULATORY_CARE_PROVIDER_SITE_OTHER): Payer: Medicaid Other | Admitting: Gastroenterology

## 2021-12-28 ENCOUNTER — Encounter (HOSPITAL_COMMUNITY): Payer: Self-pay | Admitting: Vascular Surgery

## 2021-12-28 ENCOUNTER — Other Ambulatory Visit: Payer: Self-pay

## 2021-12-28 ENCOUNTER — Encounter (INDEPENDENT_AMBULATORY_CARE_PROVIDER_SITE_OTHER): Payer: Self-pay

## 2021-12-28 ENCOUNTER — Other Ambulatory Visit (INDEPENDENT_AMBULATORY_CARE_PROVIDER_SITE_OTHER): Payer: Self-pay

## 2021-12-28 VITALS — BP 104/68 | HR 78 | Temp 98.0°F | Ht 69.0 in | Wt 150.3 lb

## 2021-12-28 DIAGNOSIS — R1110 Vomiting, unspecified: Secondary | ICD-10-CM

## 2021-12-28 DIAGNOSIS — K5900 Constipation, unspecified: Secondary | ICD-10-CM

## 2021-12-28 DIAGNOSIS — Z1211 Encounter for screening for malignant neoplasm of colon: Secondary | ICD-10-CM

## 2021-12-28 NOTE — Progress Notes (Signed)
Referring Provider: Waldon Reining, MD Primary Care Physician:  Waldon Reining, MD Primary GI Physician: new patient  Chief Complaint  Patient presents with   Regurgitate    Patient here today due to having her food come back up after swallowing. Denies any other Gi issues.   HPI:   Katie Young is a 46 y.o. female with past medical history of CKD, COPD on chronic supplemental O2, GERD, DM type II and previous alcohol abuse.  Patient presenting today as a new patient for regurgitation.  She is currently maintained on pantoprazole 40mg  once daily and pepcid 20mg  nightly, GERD is well controlled on current regimen. She states that almost anything she eats or pills she takes come right back up within just a few minutes. She is typically able to keep liquids down without issue and some foods she is able to keep down well, usually smaller amounts of food or lighter foods. She reports that symptoms began suddenly a few weeks ago. She denies any nausea or abdominal pain. Has some occasionally mild dysphagia. She has not had any hematemesis. She states that she does not have vomiting if she is not eating or taking pills. She denies rectal bleeding, states she had some previous black stools but is unsure of the last time she had this. Appetite and weight are stable. She is having maybe 2 BMs per week, states she has recently been refusing Miralax at the rehab facility she is at because she did not want to have too many BMs.   NSAID use: no NSAIDs Social hx: previously drank alcohol (beer- atleast two 40 oz per day), previous smoker Fam hx: no CRC or liver disease.  Last Colonoscopy:n/a Last Endoscopy:n/a  Past Medical History:  Diagnosis Date   Chronic kidney disease    COPD (chronic obstructive pulmonary disease) (HCC)    Lower leg fracture    Past Surgical History:  Procedure Laterality Date   ABDOMINAL AORTOGRAM W/LOWER EXTREMITY N/A 12/24/2021   Procedure: ABDOMINAL AORTOGRAM  W/LOWER EXTREMITY;  Surgeon: , MD;  Location: [facility]VAS[facility];  Service: Cardiovascular;  Laterality: N/A;   FRACTURE SURGERY     TUBAL LIGATION     Current Outpatient Medications  Medication Sig Dispense Refill   acetaminophen (TYLENOL) 325 MG tablet Take 650 mg by mouth every 6 (six) hours as needed for fever (Pain).     albuterol (VENTOLIN HFA) 108 (90 Base) MCG/ACT inhaler Inhale 2 puffs into the lungs every 6 (six) hours as needed for wheezing or shortness of breath.     ascorbic acid (VITAMIN C) 500 MG tablet Take 1,000 mg by mouth 2 (two) times daily.     aspirin 81 MG EC tablet Take 1 tablet (81 mg total) by mouth daily. 150 tablet 2   atorvastatin (LIPITOR) 10 MG tablet Take 1 tablet (10 mg total) by mouth daily. 30 tablet 11   busPIRone (BUSPAR) 10 MG tablet Take 10 mg by mouth 3 (three) times daily.     docusate sodium (COLACE) 100 MG capsule Take 100 mg by mouth daily.     famotidine (PEPCID) 20 MG tablet Take 20 mg by mouth every evening.     folic acid (FOLVITE) 1 MG tablet Take 1 mg by mouth daily.     gabapentin (NEURONTIN) 400 MG capsule Take 400 mg by mouth 3 (three) times daily.     glycerin adult 2 g suppository Place 1 suppository rectally as needed for constipation.  hydrOXYzine (ATARAX) 50 MG tablet Take 50 mg by mouth every 4 (four) hours as needed for itching.    ° ipratropium-albuterol (DUONEB) 0.5-2.5 (3) MG/3ML SOLN Take 3 mLs by nebulization every 6 (six) hours as needed (Shortness of Breath or Wheezing).    ° pantoprazole (PROTONIX) 40 MG tablet Take 40 mg by mouth daily.    ° polyethylene glycol (MIRALAX / GLYCOLAX) 17 g packet Take 17 g by mouth 2 (two) times daily. 4-6 oz of appropriate additive    ° thiamine 100 MG tablet Take 100 mg by mouth daily.    ° zinc gluconate 50 MG tablet Take 50 mg by mouth daily.    ° traZODone (DESYREL) 50 MG tablet Take 50 mg by mouth at bedtime as needed for sleep. (Patient not taking: Reported on 12/28/2021)     ° °No current facility-administered medications for this visit.  ° ° °Allergies as of 12/28/2021 - Review Complete 12/28/2021  °Allergen Reaction Noted  ° Sulfa antibiotics Nausea And Vomiting 04/15/2014  ° ° °History reviewed. No pertinent family history. ° °Social History  ° °Socioeconomic History  ° Marital status: Divorced  °  Spouse name: Not on file  ° Number of children: Not on file  ° Years of education: Not on file  ° Highest education level: Not on file  °Occupational History  ° Not on file  °Tobacco Use  ° Smoking status: Former  °  Packs/day: 1.00  °  Types: Cigarettes  ° Smokeless tobacco: Not on file  °Vaping Use  ° Vaping Use: Never used  °Substance and Sexual Activity  ° Alcohol use: Yes  °  Comment: weekend use  ° Drug use: No  ° Sexual activity: Not on file  °Other Topics Concern  ° Not on file  °Social History Narrative  ° Not on file  ° °Social Determinants of Health  ° °Financial Resource Strain: Not on file  °Food Insecurity: Not on file  °Transportation Needs: Not on file  °Physical Activity: Not on file  °Stress: Not on file  °Social Connections: Not on file  ° °Review of systems °General: negative for malaise, night sweats, fever, chills, weight loss °Neck: Negative for lumps, goiter, pain and significant neck swelling °Resp: Negative for cough, wheezing, dyspnea at rest °CV: Negative for chest pain, leg swelling, palpitations, orthopnea °GI: denies melena, hematochezia, nausea, diarrhea, odyonophagia, early satiety or unintentional weight loss. +regurgitation of food +constipation +occasional dysphagia °MSK: Negative for joint pain or swelling, back pain, and muscle pain. °Derm: Negative for itching or rash °Psych: Denies depression, anxiety, memory loss, confusion. No homicidal or suicidal ideation.  °Heme: Negative for prolonged bleeding, bruising easily, and swollen nodes. °Endocrine: Negative for cold or heat intolerance, polyuria, polydipsia and goiter. °Neuro: negative for tremor,  gait imbalance, syncope and seizures. °The remainder of the review of systems is noncontributory. ° °Physical Exam: °BP 104/68 (BP Location: Left Arm, Patient Position: Sitting, Cuff Size: Large)    Pulse 78    Temp 98 °F (36.7 °C) (Oral)    Ht 5' 9" (1.753 m)    Wt 150 lb 4.8 oz (68.2 kg)    BMI 22.20 kg/m²  °General:   Alert and oriented. No distress noted. Pleasant and cooperative.  °Head:  Normocephalic and atraumatic. °Eyes:  Conjuctiva clear without scleral icterus. °Mouth:  Oral mucosa pink and moist. Good dentition. No lesions. °Heart: Normal rate and rhythm, s1 and s2 heart sounds present.  °Lungs: Clear lung sounds in all lobes.   Respirations equal and unlabored. Abdomen:  +BS, soft, non-tender and non-distended. No rebound or guarding. No HSM or masses noted. Derm: No palmar erythema or jaundice Msk:  Symmetrical without gross deformities. Normal posture. Extremities:  Without edema. Neurologic:  Alert and  oriented x4 Psych:  Alert and cooperative. Normal mood and affect.  Invalid input(s): 6 MONTHS   ASSESSMENT: Katie Young is a 46 y.o. female presenting today for regurgitation of food.  Regurgitation of foods and pills almost everytime she eats over the past few weeks, she has no other associated symptoms and reports that appetite is good and weight is stable. We will proceed with EGD for further evaluation of her ongoing regurgitation. She should continue to take small bites and try to eat smaller portions of food as she seems to tolerate this better.   She also endorses constipation with maybe 2 BMs per week. Per MAR from rehab facility, it appears she has miralax 17g BID PRN ordered, though she tells me she has recently been refusing it. I discussed with her the importance of regular BMs. She is agreeable to take miralax, we will start with 1 capful everyday, can increase to 2 capfuls if no improvement after 1 week.   As she has also never had a CRC screening colonsocopy, we will  get her scheduled for this as well.   GERD is well maintained with current regiment of PPI and H2B, both once daily.  Indications, risks and benefits of procedure discussed in detail with patient. Patient verbalized understanding and is in agreement to proceed with EGD/Colonoscopy at this time.    PLAN:  Try daily miralax dosing instead of PRN, start with 1 capful/day 2.  Continue pantoprazole 40mg  and pepcid 20mg  once daily 3. Schedule CRC screening colonoscopy 4. Schedule EGD for regurgitation.   Follow Up: TBD after endoscopies  Lilia Letterman L. , MSN, APRN, AGNP-C Adult-Gerontology Nurse Practitioner [facility]

## 2021-12-28 NOTE — H&P (View-Only) (Signed)
Referring Provider: Waldon Reining, MD Primary Care Physician:  Waldon Reining, MD Primary GI Physician: new patient  Chief Complaint  Patient presents with   Regurgitate    Patient here today due to having her food come back up after swallowing. Denies any other Gi issues.   HPI:   Katie Young is a 46 y.o. female with past medical history of CKD, COPD on chronic supplemental O2, GERD, DM type II and previous alcohol abuse.  Patient presenting today as a new patient for regurgitation.  She is currently maintained on pantoprazole 40mg  once daily and pepcid 20mg  nightly, GERD is well controlled on current regimen. She states that almost anything she eats or pills she takes come right back up within just a few minutes. She is typically able to keep liquids down without issue and some foods she is able to keep down well, usually smaller amounts of food or lighter foods. She reports that symptoms began suddenly a few weeks ago. She denies any nausea or abdominal pain. Has some occasionally mild dysphagia. She has not had any hematemesis. She states that she does not have vomiting if she is not eating or taking pills. She denies rectal bleeding, states she had some previous black stools but is unsure of the last time she had this. Appetite and weight are stable. She is having maybe 2 BMs per week, states she has recently been refusing Miralax at the rehab facility she is at because she did not want to have too many BMs.   NSAID use: no NSAIDs Social hx: previously drank alcohol (beer- atleast two 40 oz per day), previous smoker Fam hx: no CRC or liver disease.  Last Colonoscopy:n/a Last Endoscopy:n/a  Past Medical History:  Diagnosis Date   Chronic kidney disease    COPD (chronic obstructive pulmonary disease) (HCC)    Lower leg fracture    Past Surgical History:  Procedure Laterality Date   ABDOMINAL AORTOGRAM W/LOWER EXTREMITY N/A 12/24/2021   Procedure: ABDOMINAL AORTOGRAM  W/LOWER EXTREMITY;  Surgeon: , MD;  Location: [facility];  Service: Cardiovascular;  Laterality: N/A;   FRACTURE SURGERY     TUBAL LIGATION     Current Outpatient Medications  Medication Sig Dispense Refill   acetaminophen (TYLENOL) 325 MG tablet Take 650 mg by mouth every 6 (six) hours as needed for fever (Pain).     albuterol (VENTOLIN HFA) 108 (90 Base) MCG/ACT inhaler Inhale 2 puffs into the lungs every 6 (six) hours as needed for wheezing or shortness of breath.     ascorbic acid (VITAMIN C) 500 MG tablet Take 1,000 mg by mouth 2 (two) times daily.     aspirin 81 MG EC tablet Take 1 tablet (81 mg total) by mouth daily. 150 tablet 2   atorvastatin (LIPITOR) 10 MG tablet Take 1 tablet (10 mg total) by mouth daily. 30 tablet 11   busPIRone (BUSPAR) 10 MG tablet Take 10 mg by mouth 3 (three) times daily.     docusate sodium (COLACE) 100 MG capsule Take 100 mg by mouth daily.     famotidine (PEPCID) 20 MG tablet Take 20 mg by mouth every evening.     folic acid (FOLVITE) 1 MG tablet Take 1 mg by mouth daily.     gabapentin (NEURONTIN) 400 MG capsule Take 400 mg by mouth 3 (three) times daily.     glycerin adult 2 g suppository Place 1 suppository rectally as needed for constipation.  hydrOXYzine (ATARAX) 50 MG tablet Take 50 mg by mouth every 4 (four) hours as needed for itching.     ipratropium-albuterol (DUONEB) 0.5-2.5 (3) MG/3ML SOLN Take 3 mLs by nebulization every 6 (six) hours as needed (Shortness of Breath or Wheezing).     pantoprazole (PROTONIX) 40 MG tablet Take 40 mg by mouth daily.     polyethylene glycol (MIRALAX / GLYCOLAX) 17 g packet Take 17 g by mouth 2 (two) times daily. 4-6 oz of appropriate additive     thiamine 100 MG tablet Take 100 mg by mouth daily.     zinc gluconate 50 MG tablet Take 50 mg by mouth daily.     traZODone (DESYREL) 50 MG tablet Take 50 mg by mouth at bedtime as needed for sleep. (Patient not taking: Reported on 12/28/2021)      No current facility-administered medications for this visit.    Allergies as of 12/28/2021 - Review Complete 12/28/2021  Allergen Reaction Noted   Sulfa antibiotics Nausea And Vomiting 04/15/2014    History reviewed. No pertinent family history.  Social History   Socioeconomic History   Marital status: Divorced    Spouse name: Not on file   Number of children: Not on file   Years of education: Not on file   Highest education level: Not on file  Occupational History   Not on file  Tobacco Use   Smoking status: Former    Packs/day: 1.00    Types: Cigarettes   Smokeless tobacco: Not on file  Vaping Use   Vaping Use: Never used  Substance and Sexual Activity   Alcohol use: Yes    Comment: weekend use   Drug use: No   Sexual activity: Not on file  Other Topics Concern   Not on file  Social History Narrative   Not on file   Social Determinants of Health   Financial Resource Strain: Not on file  Food Insecurity: Not on file  Transportation Needs: Not on file  Physical Activity: Not on file  Stress: Not on file  Social Connections: Not on file   Review of systems General: negative for malaise, night sweats, fever, chills, weight loss Neck: Negative for lumps, goiter, pain and significant neck swelling Resp: Negative for cough, wheezing, dyspnea at rest CV: Negative for chest pain, leg swelling, palpitations, orthopnea GI: denies melena, hematochezia, nausea, diarrhea, odyonophagia, early satiety or unintentional weight loss. +regurgitation of food +constipation +occasional dysphagia MSK: Negative for joint pain or swelling, back pain, and muscle pain. Derm: Negative for itching or rash Psych: Denies depression, anxiety, memory loss, confusion. No homicidal or suicidal ideation.  Heme: Negative for prolonged bleeding, bruising easily, and swollen nodes. Endocrine: Negative for cold or heat intolerance, polyuria, polydipsia and goiter. Neuro: negative for tremor,  gait imbalance, syncope and seizures. The remainder of the review of systems is noncontributory.  Physical Exam: BP 104/68 (BP Location: Left Arm, Patient Position: Sitting, Cuff Size: Large)    Pulse 78    Temp 98 F (36.7 C) (Oral)    Ht 5\' 9"  (1.753 m)    Wt 150 lb 4.8 oz (68.2 kg)    BMI 22.20 kg/m  General:   Alert and oriented. No distress noted. Pleasant and cooperative.  Head:  Normocephalic and atraumatic. Eyes:  Conjuctiva clear without scleral icterus. Mouth:  Oral mucosa pink and moist. Good dentition. No lesions. Heart: Normal rate and rhythm, s1 and s2 heart sounds present.  Lungs: Clear lung sounds in all lobes.  Respirations equal and unlabored. Abdomen:  +BS, soft, non-tender and non-distended. No rebound or guarding. No HSM or masses noted. Derm: No palmar erythema or jaundice Msk:  Symmetrical without gross deformities. Normal posture. Extremities:  Without edema. Neurologic:  Alert and  oriented x4 Psych:  Alert and cooperative. Normal mood and affect.  Invalid input(s): 6 MONTHS   ASSESSMENT: Katie Young is a 46 y.o. female presenting today for regurgitation of food.  Regurgitation of foods and pills almost everytime she eats over the past few weeks, she has no other associated symptoms and reports that appetite is good and weight is stable. We will proceed with EGD for further evaluation of her ongoing regurgitation. She should continue to take small bites and try to eat smaller portions of food as she seems to tolerate this better.   She also endorses constipation with maybe 2 BMs per week. Per MAR from rehab facility, it appears she has miralax 17g BID PRN ordered, though she tells me she has recently been refusing it. I discussed with her the importance of regular BMs. She is agreeable to take miralax, we will start with 1 capful everyday, can increase to 2 capfuls if no improvement after 1 week.   As she has also never had a CRC screening colonsocopy, we will  get her scheduled for this as well.   GERD is well maintained with current regiment of PPI and H2B, both once daily.  Indications, risks and benefits of procedure discussed in detail with patient. Patient verbalized understanding and is in agreement to proceed with EGD/Colonoscopy at this time.    PLAN:  Try daily miralax dosing instead of PRN, start with 1 capful/day 2.  Continue pantoprazole 40mg  and pepcid 20mg  once daily 3. Schedule CRC screening colonoscopy 4. Schedule EGD for regurgitation.   Follow Up: TBD after endoscopies  Prescious Hurless L. , MSN, APRN, AGNP-C Adult-Gerontology Nurse Practitioner [facility]

## 2021-12-28 NOTE — Patient Instructions (Addendum)
Start taking Miralax 1 capful every day for one week. If bowel movements do not improve, increase to 1 capful every 12 hours. If after two weeks there is no improvement, increase to 1 capful every 8 hours  We will get you scheduled for an EGD for further evaluation of your issues keeping foods down, continue to eat small portions and take small bites. We will also proceed with colonoscopy for initial screening of colon cancer as you have never had one before.  Continue pantoprazole 40mg  once daily and pepcid 20mg  nightly

## 2021-12-29 ENCOUNTER — Encounter (HOSPITAL_COMMUNITY)
Admission: RE | Admit: 2021-12-29 | Discharge: 2021-12-29 | Disposition: A | Discharge status: Home / Self Care | Payer: Medicaid Other | Source: Ambulatory Visit | Attending: Gastroenterology | Admitting: Gastroenterology

## 2021-12-29 ENCOUNTER — Encounter (HOSPITAL_COMMUNITY): Payer: Self-pay | Admitting: *Deleted

## 2021-12-29 DIAGNOSIS — R1110 Vomiting, unspecified: Secondary | ICD-10-CM

## 2021-12-29 DIAGNOSIS — Z1211 Encounter for screening for malignant neoplasm of colon: Secondary | ICD-10-CM

## 2021-12-29 NOTE — Patient Instructions (Signed)
TEREASE MARCOTTE  12/29/2021     @PREFPERIOPPHARMACY @   Your procedure is scheduled on  12/31/2021.   Report to 02/28/2022 at  0700 A.M.   Call this number if you have problems the morning of surgery:  [phone]   Remember:  Follow the diet and prep instructions given to you by the office.    Use your nebulizer and your inhalers before you come and bring your rescue inhaler with you.    Take these medicines the morning of surgery with A SIP OF WATER                 buspar, gabapentin, atarax(if needed), protonix.     Do not wear jewelry, make-up or nail polish.  Do not wear lotions, powders, or perfumes, or deodorant.  Do not shave 48 hours prior to surgery.  Men may shave face and neck.  Do not bring valuables to the hospital.  [facility] is not responsible for any belongings or valuables.  Contacts, dentures or bridgework may not be worn into surgery.  Leave your suitcase in the car.  After surgery it may be brought to your room.  For patients admitted to the hospital, discharge time will be determined by your treatment team.  Patients discharged the day of surgery will not be allowed to drive home and must have someone with them for 24 hours.    Special instructions:   DO NOT smoke tobacco or vape for 24 hours before your procedure.  Please read over the following fact sheets that you were given. Anesthesia Post-op Instructions and Care and Recovery After Surgery      Upper Endoscopy, Adult, Care After This sheet gives you information about how to care for yourself after your procedure. Your health care provider may also give you more specific instructions. If you have problems or questions, contact your health care provider. What can I expect after the procedure? After the procedure, it is common to have: A sore throat. Mild stomach pain or discomfort. Bloating. Nausea. Follow these instructions at home:  Follow instructions from your health  care provider about what to eat or drink after your procedure. Return to your normal activities as told by your health care provider. Ask your health care provider what activities are safe for you. Take over-the-counter and prescription medicines only as told by your health care provider. If you were given a sedative during the procedure, it can affect you for several hours. Do not drive or operate machinery until your health care provider says that it is safe. Keep all follow-up visits as told by your health care provider. This is important. Contact a health care provider if you have: A sore throat that lasts longer than one day. Trouble swallowing. Get help right away if: You vomit blood or your vomit looks like coffee grounds. You have: A fever. Bloody, black, or tarry stools. A severe sore throat or you cannot swallow. Difficulty breathing. Severe pain in your chest or abdomen. Summary After the procedure, it is common to have a sore throat, mild stomach discomfort, bloating, and nausea. If you were given a sedative during the procedure, it can affect you for several hours. Do not drive or operate machinery until your health care provider says that it is safe. Follow instructions from your health care provider about what to eat or drink after your procedure. Return to your normal activities as told by your health care  provider. This information is not intended to replace advice given to you by your health care provider. Make sure you discuss any questions you have with your health care provider. Document Revised: 10/18/2019 Document Reviewed: 05/14/2018 Elsevier Patient Education  2022 [employer] Colonoscopy, Adult, Care After This sheet gives you information about how to care for yourself after your procedure. Your health care provider may also give you more specific instructions. If you have problems or questions, contact your health care provider. What can I expect after the  procedure? After the procedure, it is common to have: A small amount of blood in your stool for 24 hours after the procedure. Some gas. Mild cramping or bloating of your abdomen. Follow these instructions at home: Eating and drinking  Drink enough fluid to keep your urine pale yellow. Follow instructions from your health care provider about eating or drinking restrictions. Resume your normal diet as instructed by your health care provider. Avoid heavy or fried foods that are hard to digest. Activity Rest as told by your health care provider. Avoid sitting for a long time without moving. Get up to take short walks every 1-2 hours. This is important to improve blood flow and breathing. Ask for help if you feel weak or unsteady. Return to your normal activities as told by your health care provider. Ask your health care provider what activities are safe for you. Managing cramping and bloating  Try walking around when you have cramps or feel bloated. Apply heat to your abdomen as told by your health care provider. Use the heat source that your health care provider recommends, such as a moist heat pack or a heating pad. Place a towel between your skin and the heat source. Leave the heat on for 20-30 minutes. Remove the heat if your skin turns bright red. This is especially important if you are unable to feel pain, heat, or cold. You may have a greater risk of getting burned. General instructions If you were given a sedative during the procedure, it can affect you for several hours. Do not drive or operate machinery until your health care provider says that it is safe. For the first 24 hours after the procedure: Do not sign important documents. Do not drink alcohol. Do your regular daily activities at a slower pace than normal. Eat soft foods that are easy to digest. Take over-the-counter and prescription medicines only as told by your health care provider. Keep all follow-up visits as told by  your health care provider. This is important. Contact a health care provider if: You have blood in your stool 2-3 days after the procedure. Get help right away if you have: More than a small spotting of blood in your stool. Large blood clots in your stool. Swelling of your abdomen. Nausea or vomiting. A fever. Increasing pain in your abdomen that is not relieved with medicine. Summary After the procedure, it is common to have a small amount of blood in your stool. You may also have mild cramping and bloating of your abdomen. If you were given a sedative during the procedure, it can affect you for several hours. Do not drive or operate machinery until your health care provider says that it is safe. Get help right away if you have a lot of blood in your stool, nausea or vomiting, a fever, or increased pain in your abdomen. This information is not intended to replace advice given to you by your health care provider. Make sure you discuss any  questions you have with your health care provider. Document Revised: 10/18/2019 Document Reviewed: 07/08/2019 Elsevier Patient Education  2022 Elsevier Inc. Monitored Anesthesia Care, Care After This sheet gives you information about how to care for yourself after your procedure. Your health care provider may also give you more specific instructions. If you have problems or questions, contact your health care provider. What can I expect after the procedure? After the procedure, it is common to have: Tiredness. Forgetfulness about what happened after the procedure. Impaired judgment for important decisions. Nausea or vomiting. Some difficulty with balance. Follow these instructions at home: For the time period you were told by your health care provider:   Rest as needed. Do not participate in activities where you could fall or become injured. Do not drive or use machinery. Do not drink alcohol. Do not take sleeping pills or medicines that cause  drowsiness. Do not make important decisions or sign legal documents. Do not take care of children on your own. Eating and drinking Follow the diet that is recommended by your health care provider. Drink enough fluid to keep your urine pale yellow. If you vomit: Drink water, juice, or soup when you can drink without vomiting. Make sure you have little or no nausea before eating solid foods. General instructions Have a responsible adult stay with you for the time you are told. It is important to have someone help care for you until you are awake and alert. Take over-the-counter and prescription medicines only as told by your health care provider. If you have sleep apnea, surgery and certain medicines can increase your risk for breathing problems. Follow instructions from your health care provider about wearing your sleep device: Anytime you are sleeping, including during daytime naps. While taking prescription pain medicines, sleeping medicines, or medicines that make you drowsy. Avoid smoking. Keep all follow-up visits as told by your health care provider. This is important. Contact a health care provider if: You keep feeling nauseous or you keep vomiting. You feel light-headed. You are still sleepy or having trouble with balance after 24 hours. You develop a rash. You have a fever. You have redness or swelling around the IV site. Get help right away if: You have trouble breathing. You have new-onset confusion at home. Summary For several hours after your procedure, you may feel tired. You may also be forgetful and have poor judgment. Have a responsible adult stay with you for the time you are told. It is important to have someone help care for you until you are awake and alert. Rest as told. Do not drive or operate machinery. Do not drink alcohol or take sleeping pills. Get help right away if you have trouble breathing, or if you suddenly become confused. This information is not  intended to replace advice given to you by your health care provider. Make sure you discuss any questions you have with your health care provider. Document Revised: 08/27/2020 Document Reviewed: 11/14/2019 Elsevier Patient Education  2022 [employer].

## 2021-12-31 ENCOUNTER — Ambulatory Visit (HOSPITAL_COMMUNITY): Payer: Medicaid Other | Admitting: Anesthesiology

## 2021-12-31 ENCOUNTER — Encounter (HOSPITAL_COMMUNITY)
Admission: RE | Disposition: A | Discharge status: Rehabilitation Facility | Payer: Self-pay | Source: Home / Self Care | Attending: Gastroenterology

## 2021-12-31 ENCOUNTER — Ambulatory Visit (HOSPITAL_COMMUNITY)
Admission: RE | Admit: 2021-12-31 | Discharge: 2021-12-31 | Disposition: A | Discharge status: Rehabilitation Facility | Payer: Medicaid Other | Attending: Gastroenterology | Admitting: Gastroenterology

## 2021-12-31 ENCOUNTER — Other Ambulatory Visit: Payer: Self-pay

## 2021-12-31 ENCOUNTER — Encounter (HOSPITAL_COMMUNITY): Payer: Self-pay | Admitting: Gastroenterology

## 2021-12-31 ENCOUNTER — Other Ambulatory Visit (HOSPITAL_COMMUNITY): Payer: Self-pay

## 2021-12-31 DIAGNOSIS — Z87891 Personal history of nicotine dependence: Secondary | ICD-10-CM | POA: Insufficient documentation

## 2021-12-31 DIAGNOSIS — Z9981 Dependence on supplemental oxygen: Secondary | ICD-10-CM | POA: Insufficient documentation

## 2021-12-31 DIAGNOSIS — K2100 Gastro-esophageal reflux disease with esophagitis, without bleeding: Secondary | ICD-10-CM | POA: Insufficient documentation

## 2021-12-31 DIAGNOSIS — J449 Chronic obstructive pulmonary disease, unspecified: Secondary | ICD-10-CM | POA: Insufficient documentation

## 2021-12-31 DIAGNOSIS — K6389 Other specified diseases of intestine: Secondary | ICD-10-CM | POA: Insufficient documentation

## 2021-12-31 DIAGNOSIS — F1021 Alcohol dependence, in remission: Secondary | ICD-10-CM | POA: Diagnosis not present

## 2021-12-31 DIAGNOSIS — R1110 Vomiting, unspecified: Secondary | ICD-10-CM | POA: Insufficient documentation

## 2021-12-31 DIAGNOSIS — K5900 Constipation, unspecified: Secondary | ICD-10-CM | POA: Diagnosis not present

## 2021-12-31 DIAGNOSIS — Z1211 Encounter for screening for malignant neoplasm of colon: Secondary | ICD-10-CM | POA: Insufficient documentation

## 2021-12-31 DIAGNOSIS — K219 Gastro-esophageal reflux disease without esophagitis: Secondary | ICD-10-CM | POA: Insufficient documentation

## 2021-12-31 DIAGNOSIS — N189 Chronic kidney disease, unspecified: Secondary | ICD-10-CM | POA: Diagnosis not present

## 2021-12-31 DIAGNOSIS — R1310 Dysphagia, unspecified: Secondary | ICD-10-CM | POA: Diagnosis not present

## 2021-12-31 DIAGNOSIS — E1122 Type 2 diabetes mellitus with diabetic chronic kidney disease: Secondary | ICD-10-CM | POA: Diagnosis not present

## 2021-12-31 DIAGNOSIS — K222 Esophageal obstruction: Secondary | ICD-10-CM | POA: Insufficient documentation

## 2021-12-31 HISTORY — PX: COLONOSCOPY WITH PROPOFOL: SHX5780

## 2021-12-31 HISTORY — PX: BIOPSY: SHX5522

## 2021-12-31 HISTORY — PX: ESOPHAGOGASTRODUODENOSCOPY (EGD) WITH PROPOFOL: SHX5813

## 2021-12-31 HISTORY — PX: ESOPHAGEAL DILATION: SHX303

## 2021-12-31 LAB — HM COLONOSCOPY

## 2021-12-31 SURGERY — COLONOSCOPY WITH PROPOFOL
Anesthesia: General

## 2021-12-31 MED ORDER — LACTATED RINGERS IV SOLN: INTRAVENOUS | Status: DC | PRN

## 2021-12-31 MED ORDER — PANTOPRAZOLE SODIUM 40 MG PO TBEC
40.0000 mg | DELAYED_RELEASE_TABLET | Freq: Two times a day (BID) | ORAL | 3 refills | Status: DC
  Filled 2021-12-31: qty 180, 90d supply, fill #0

## 2021-12-31 MED ORDER — PROPOFOL 10 MG/ML IV BOLUS
INTRAVENOUS | Status: DC | PRN
  Administered 2021-12-31: 50 mg via INTRAVENOUS

## 2021-12-31 MED ORDER — LIDOCAINE HCL (CARDIAC) PF 100 MG/5ML IV SOSY
PREFILLED_SYRINGE | INTRAVENOUS | Status: DC | PRN
Start: 2021-12-31 — End: 2021-12-31
  Administered 2021-12-31: 50 mg via INTRAVENOUS

## 2021-12-31 MED ORDER — PROPOFOL 1000 MG/100ML IV EMUL
INTRAVENOUS | Status: AC
  Filled 2021-12-31: qty 200

## 2021-12-31 MED ORDER — DEXMEDETOMIDINE (PRECEDEX) IN NS 20 MCG/5ML (4 MCG/ML) IV SYRINGE
PREFILLED_SYRINGE | INTRAVENOUS | Status: DC | PRN
  Administered 2021-12-31: 8 ug via INTRAVENOUS
  Administered 2021-12-31: 4 ug via INTRAVENOUS
  Administered 2021-12-31: 8 ug via INTRAVENOUS

## 2021-12-31 MED ORDER — PROPOFOL 500 MG/50ML IV EMUL
INTRAVENOUS | Status: DC | PRN
  Administered 2021-12-31: 150 ug/kg/min via INTRAVENOUS

## 2021-12-31 MED ORDER — PHENYLEPHRINE HCL (PRESSORS) 10 MG/ML IV SOLN
INTRAVENOUS | Status: DC | PRN
Start: 2021-12-31 — End: 2021-12-31
  Administered 2021-12-31 (×7): 100 ug via INTRAVENOUS

## 2021-12-31 MED ORDER — PANTOPRAZOLE SODIUM 40 MG PO TBEC: 40.0000 mg | DELAYED_RELEASE_TABLET | Freq: Two times a day (BID) | ORAL | 3 refills | Status: DC

## 2021-12-31 NOTE — Interval H&P Note (Signed)
History and Physical Interval Note:  12/31/2021 7:52 AM  Katie Young  has presented today for surgery, with the diagnosis of screening colonoscopy food regurgitation.  The various methods of treatment have been discussed with the patient and family. After consideration of risks, benefits and other options for treatment, the patient has consented to  Procedure(s) with comments: COLONOSCOPY WITH PROPOFOL (N/A) - 815 ESOPHAGOGASTRODUODENOSCOPY (EGD) WITH PROPOFOL (N/A) as a surgical intervention.  The patient's history has been reviewed, patient examined, no change in status, stable for surgery.  I have reviewed the patient's chart and labs.  Questions were answered to the patient's satisfaction.     Katrinka Blazing Mayorga

## 2021-12-31 NOTE — Op Note (Signed)
[facility] Patient Name: Katie Young Procedure Date: 12/31/2021 8:20 AM MRN: [id number] Date of Birth: [birth year] Attending MD: Maylon Peppers ,  CSN: [id number] Age: 46 Admit Type: Outpatient Procedure:                Colonoscopy Indications:              Screening for colorectal malignant neoplasm Providers:                Maylon Peppers, Hughie Closs, RN, Tammy Vaught,                            RN, Nelma Rothman, Technician Referring MD:              Medicines:                Monitored Anesthesia Care Complications:            No immediate complications. Estimated Blood Loss:     Estimated blood loss: none. Procedure:                Pre-Anesthesia Assessment:                           - Prior to the procedure, a History and Physical                            was performed, and patient medications, allergies                            and sensitivities were reviewed. The patient's                            tolerance of previous anesthesia was reviewed.                           - The risks and benefits of the procedure and the                            sedation options and risks were discussed with the                            patient. All questions were answered and informed                            consent was obtained.                           - ASA Grade Assessment: II - A patient with mild                            systemic disease.                           After obtaining informed consent, the colonoscope                            was passed under direct vision. Throughout the  procedure, the patient's blood pressure, pulse, and                            oxygen saturations were monitored continuously. The                            [id number] scope was introduced through                            the anus and advanced to the the cecum, identified                            by appendiceal orifice and ileocecal valve. The                             colonoscopy was performed without difficulty. The                            patient tolerated the procedure well. The quality                            of the bowel preparation was good. Scope In: 8:26:58 AM Scope Out: 8:50:45 AM Scope Withdrawal Time: 0 hours 17 minutes 12 seconds  Total Procedure Duration: 0 hours 23 minutes 47 seconds  Findings:      The perianal and digital rectal examinations were normal.      A localized area of mildly nodular mucosa was found in the cecum.       Biopsies were taken with a cold forceps for histology.      The exam was otherwise without abnormality on direct and retroflexion       views. Impression:               - Nodular mucosa in the cecum. Biopsied.                           - The examination was otherwise normal on direct                            and retroflexion views. Moderate Sedation:      Per Anesthesia Care Recommendation:           - Discharge patient to home (ambulatory).                           - Resume previous diet.                           - Await pathology results.                           - Repeat colonoscopy date to be determined after                            pending pathology results are reviewed for  screening purposes. Procedure Code(s):        --- Professional ---                           [phone], Colonoscopy, flexible; with biopsy, single                            or multiple Diagnosis Code(s):        --- Professional ---                           Z12.11, Encounter for screening for malignant                            neoplasm of colon                           K63.89, Other specified diseases of intestine CPT copyright 2019 [employer]. All rights reserved. The codes documented in this report are preliminary and upon coder review may  be revised to meet current compliance requirements. Maylon Peppers, MD Maylon Peppers,  12/31/2021  8:59:49 AM This report has been signed electronically. Number of Addenda: 0

## 2021-12-31 NOTE — Transfer of Care (Signed)
Immediate Anesthesia Transfer of Care Note  Patient: Katie Young  Procedure(s) Performed: COLONOSCOPY WITH PROPOFOL ESOPHAGOGASTRODUODENOSCOPY (EGD) WITH PROPOFOL ESOPHAGEAL DILATION BIOPSY  Patient Location: PACU  Anesthesia Type:General  Level of Consciousness: awake, alert , oriented and patient cooperative  Airway & Oxygen Therapy: Patient Spontanous Breathing  Post-op Assessment: Report given to RN, Post -op Vital signs reviewed and stable and Patient moving all extremities X 4  Post vital signs: Reviewed and stable  Last Vitals:  Vitals Value Taken Time  BP 80/39 12/31/21 0859  Temp 36.6 C 12/31/21 0859  Pulse 89 12/31/21 0859  Resp 22 12/31/21 0859  SpO2 95 % 12/31/21 0859    Last Pain:  Vitals:   12/31/21 0859  TempSrc: Oral  PainSc: 0-No pain         Complications: No notable events documented.

## 2021-12-31 NOTE — Anesthesia Postprocedure Evaluation (Signed)
Anesthesia Post Note  Patient: SHONIKA KOLASINSKI  Procedure(s) Performed: COLONOSCOPY WITH PROPOFOL ESOPHAGOGASTRODUODENOSCOPY (EGD) WITH PROPOFOL ESOPHAGEAL DILATION BIOPSY  Patient location during evaluation: Phase II Anesthesia Type: General Level of consciousness: awake Pain management: pain level controlled Vital Signs Assessment: post-procedure vital signs reviewed and stable Respiratory status: spontaneous breathing and respiratory function stable Cardiovascular status: blood pressure returned to baseline and stable Postop Assessment: no headache and no apparent nausea or vomiting Anesthetic complications: no Comments: Late entry   No notable events documented.   Last Vitals:  Vitals:   12/31/21 0859 12/31/21 0903  BP: (!) 80/39 (!) 96/56  Pulse: 89 89  Resp: (!) 22 20  Temp: 36.6 C   SpO2: 95% 97%    Last Pain:  Vitals:   12/31/21 0859  TempSrc: Oral  PainSc: 0-No pain                 Windell Norfolk

## 2021-12-31 NOTE — Discharge Instructions (Addendum)
You are being discharged to home.  Resume your previous diet.  Take Protonix (pantoprazole) 40 mg by mouth twice a day.  Your physician has recommended a repeat upper endoscopy in two months for surveillance.  Stop famotidine. We are waiting for your pathology results.  Your physician has recommended a repeat colonoscopy (date to be determined after pending pathology results are reviewed) for screening purposes.

## 2021-12-31 NOTE — Progress Notes (Signed)
Report was called to [facility] and [facility] home.  [facility] transport arrived to transport patient back to [facility].

## 2021-12-31 NOTE — Op Note (Signed)
[facility] Patient Name: Katie Young Procedure Date: 12/31/2021 7:50 AM MRN: [id number] Date of Birth: August 02, 1976 Attending MD: Maylon Peppers ,  CSN: [id number] Age: 46 Admit Type: Outpatient Procedure:                Upper GI endoscopy Indications:              Dysphagia, Gastro-esophageal reflux disease Providers:                Maylon Peppers, Lambert Mody, Hughie Closs,                            RN, Nelma Rothman, Technician Referring MD:              Medicines:                Monitored Anesthesia Care Complications:            No immediate complications. Estimated Blood Loss:     Estimated blood loss: none. Procedure:                Pre-Anesthesia Assessment:                           - Prior to the procedure, a History and Physical                            was performed, and patient medications, allergies                            and sensitivities were reviewed. The patient's                            tolerance of previous anesthesia was reviewed.                           - The risks and benefits of the procedure and the                            sedation options and risks were discussed with the                            patient. All questions were answered and informed                            consent was obtained.                           - ASA Grade Assessment: II - A patient with mild                            systemic disease.                           After obtaining informed consent, the endoscope was                            passed under direct vision. Throughout the  procedure, the patient's blood pressure, pulse, and                            oxygen saturations were monitored continuously. The                            [id number] scope was introduced through the                            mouth, and advanced to the second part of duodenum.                            The upper GI endoscopy was accomplished  without                            difficulty. The patient tolerated the procedure                            well. The [id number] scope was introduced                            through the mouth, and advanced to the second part                            of duodenum. Scope In: 8:04:23 AM Scope Out: 8:20:39 AM Total Procedure Duration: 0 hours 16 minutes 16 seconds  Findings:      LA Grade C (one or more mucosal breaks continuous between tops of 2 or       more mucosal folds, less than 75% circumference) esophagitis with no       bleeding was found 34 to 38 cm from the incisors.      One benign-appearing, intrinsic severe (stenosis; an endoscope cannot       pass) stenosis was found 38 cm from the incisors. This stenosis measured       9 mm (inner diameter) x 1 cm (in length). The stenosis was traversed       after downsizing scope to an ultraslim upper endoscope and dilating. A       TTS dilator was passed through the scope. Dilation with a 10-06-11 mm       balloon dilator was performed to 12 mm.      The entire examined stomach was normal.      The examined duodenum was normal. Impression:               - LA Grade C reflux esophagitis with no bleeding.                           - Benign-appearing esophageal stenosis. Dilated.                           - Normal stomach.                           - Normal examined duodenum.                           -  No specimens collected. Moderate Sedation:      Per Anesthesia Care Recommendation:           - Discharge patient to home (ambulatory).                           - Resume previous diet.                           - Use Protonix (pantoprazole) 40 mg PO BID.                           - Stop famotidine.                           - Repeat upper endoscopy in 2 months for                            surveillance. Procedure Code(s):        --- Professional ---                           [id number], Esophagogastroduodenoscopy, flexible,                             transoral; with transendoscopic balloon dilation of                            esophagus (less than 30 mm diameter) Diagnosis Code(s):        --- Professional ---                           K21.00, Gastro-esophageal reflux disease with                            esophagitis, without bleeding                           K22.2, Esophageal obstruction                           R13.10, Dysphagia, unspecified CPT copyright 2019 American Medical Association. All rights reserved. The codes documented in this report are preliminary and upon coder review may  be revised to meet current compliance requirements. Maylon Peppers, MD Maylon Peppers,  12/31/2021 8:56:03 AM This report has been signed electronically. Number of Addenda: 0

## 2021-12-31 NOTE — Anesthesia Preprocedure Evaluation (Signed)
Anesthesia Evaluation  Patient identified by MRN, date of birth, ID band Patient awake    Reviewed: Allergy & Precautions, H&P , NPO status , Patient's Chart, lab work & pertinent test results, reviewed documented beta blocker date and time   Airway Mallampati: II  TM Distance: >3 FB Neck ROM: full    Dental no notable dental hx.    Pulmonary COPD, former smoker,    Pulmonary exam normal breath sounds clear to auscultation       Cardiovascular Exercise Tolerance: Good negative cardio ROS   Rhythm:regular Rate:Normal     Neuro/Psych negative neurological ROS  negative psych ROS   GI/Hepatic negative GI ROS, Neg liver ROS,   Endo/Other  diabetes, Type 2  Renal/GU CRFRenal disease  negative genitourinary   Musculoskeletal   Abdominal   Peds  Hematology negative hematology ROS (+)   Anesthesia Other Findings   Reproductive/Obstetrics negative OB ROS                             Anesthesia Physical Anesthesia Plan  ASA: 3  Anesthesia Plan: General   Post-op Pain Management:    Induction:   PONV Risk Score and Plan: Propofol infusion  Airway Management Planned:   Additional Equipment:   Intra-op Plan:   Post-operative Plan:   Informed Consent: I have reviewed the patients History and Physical, chart, labs and discussed the procedure including the risks, benefits and alternatives for the proposed anesthesia with the patient or authorized representative who has indicated his/her understanding and acceptance.     Dental Advisory Given  Plan Discussed with: CRNA  Anesthesia Plan Comments:         Anesthesia Quick Evaluation

## 2022-01-03 LAB — SURGICAL PATHOLOGY

## 2022-01-04 ENCOUNTER — Encounter (INDEPENDENT_AMBULATORY_CARE_PROVIDER_SITE_OTHER): Payer: Self-pay | Admitting: *Deleted

## 2022-01-06 ENCOUNTER — Encounter (HOSPITAL_COMMUNITY): Payer: Self-pay | Admitting: Gastroenterology

## 2022-03-03 ENCOUNTER — Other Ambulatory Visit (INDEPENDENT_AMBULATORY_CARE_PROVIDER_SITE_OTHER): Payer: Self-pay

## 2022-03-03 ENCOUNTER — Encounter (INDEPENDENT_AMBULATORY_CARE_PROVIDER_SITE_OTHER): Payer: Self-pay

## 2022-03-03 DIAGNOSIS — K209 Esophagitis, unspecified without bleeding: Secondary | ICD-10-CM

## 2022-03-03 DIAGNOSIS — Z01812 Encounter for preprocedural laboratory examination: Secondary | ICD-10-CM

## 2022-03-29 ENCOUNTER — Encounter (HOSPITAL_COMMUNITY): Payer: Self-pay | Admitting: Anesthesiology

## 2022-03-30 ENCOUNTER — Ambulatory Visit (HOSPITAL_COMMUNITY): Admission: RE | Admit: 2022-03-30 | Payer: Medicaid Other | Source: Home / Self Care | Admitting: Gastroenterology

## 2022-03-30 ENCOUNTER — Encounter (HOSPITAL_COMMUNITY): Admission: RE | Payer: Self-pay | Source: Home / Self Care

## 2022-03-30 SURGERY — ESOPHAGOGASTRODUODENOSCOPY (EGD) WITH PROPOFOL
Anesthesia: Monitor Anesthesia Care

## 2022-05-08 ENCOUNTER — Inpatient Hospital Stay (HOSPITAL_COMMUNITY): Payer: Medicaid Other

## 2022-05-08 ENCOUNTER — Inpatient Hospital Stay (HOSPITAL_COMMUNITY)
Admission: AD | Admit: 2022-05-08 | Discharge: 2022-05-18 | DRG: 208 | Disposition: A | Discharge status: Skilled Nursing Facility | Payer: Medicaid Other | Source: Other Acute Inpatient Hospital | Attending: Student | Admitting: Student

## 2022-05-08 DIAGNOSIS — F10139 Alcohol abuse with withdrawal, unspecified: Secondary | ICD-10-CM | POA: Diagnosis present

## 2022-05-08 DIAGNOSIS — I129 Hypertensive chronic kidney disease with stage 1 through stage 4 chronic kidney disease, or unspecified chronic kidney disease: Secondary | ICD-10-CM | POA: Diagnosis present

## 2022-05-08 DIAGNOSIS — L03116 Cellulitis of left lower limb: Secondary | ICD-10-CM | POA: Diagnosis not present

## 2022-05-08 DIAGNOSIS — J690 Pneumonitis due to inhalation of food and vomit: Secondary | ICD-10-CM | POA: Diagnosis present

## 2022-05-08 DIAGNOSIS — L89152 Pressure ulcer of sacral region, stage 2: Secondary | ICD-10-CM | POA: Diagnosis present

## 2022-05-08 DIAGNOSIS — E870 Hyperosmolality and hypernatremia: Secondary | ICD-10-CM | POA: Diagnosis present

## 2022-05-08 DIAGNOSIS — E876 Hypokalemia: Secondary | ICD-10-CM | POA: Diagnosis not present

## 2022-05-08 DIAGNOSIS — D62 Acute posthemorrhagic anemia: Secondary | ICD-10-CM | POA: Diagnosis present

## 2022-05-08 DIAGNOSIS — E669 Obesity, unspecified: Secondary | ICD-10-CM | POA: Diagnosis present

## 2022-05-08 DIAGNOSIS — M86662 Other chronic osteomyelitis, left tibia and fibula: Secondary | ICD-10-CM | POA: Diagnosis present

## 2022-05-08 DIAGNOSIS — D539 Nutritional anemia, unspecified: Secondary | ICD-10-CM | POA: Diagnosis present

## 2022-05-08 DIAGNOSIS — J9601 Acute respiratory failure with hypoxia: Secondary | ICD-10-CM

## 2022-05-08 DIAGNOSIS — K922 Gastrointestinal hemorrhage, unspecified: Principal | ICD-10-CM | POA: Diagnosis present

## 2022-05-08 DIAGNOSIS — K921 Melena: Secondary | ICD-10-CM | POA: Diagnosis present

## 2022-05-08 DIAGNOSIS — Z8673 Personal history of transient ischemic attack (TIA), and cerebral infarction without residual deficits: Secondary | ICD-10-CM

## 2022-05-08 DIAGNOSIS — L89312 Pressure ulcer of right buttock, stage 2: Secondary | ICD-10-CM | POA: Diagnosis present

## 2022-05-08 DIAGNOSIS — R5381 Other malaise: Secondary | ICD-10-CM | POA: Diagnosis not present

## 2022-05-08 DIAGNOSIS — F1010 Alcohol abuse, uncomplicated: Secondary | ICD-10-CM

## 2022-05-08 DIAGNOSIS — H5509 Other forms of nystagmus: Secondary | ICD-10-CM | POA: Diagnosis present

## 2022-05-08 DIAGNOSIS — E440 Moderate protein-calorie malnutrition: Secondary | ICD-10-CM | POA: Diagnosis present

## 2022-05-08 DIAGNOSIS — L8990 Pressure ulcer of unspecified site, unspecified stage: Secondary | ICD-10-CM | POA: Insufficient documentation

## 2022-05-08 DIAGNOSIS — E2740 Unspecified adrenocortical insufficiency: Secondary | ICD-10-CM | POA: Diagnosis present

## 2022-05-08 DIAGNOSIS — N179 Acute kidney failure, unspecified: Secondary | ICD-10-CM | POA: Diagnosis present

## 2022-05-08 DIAGNOSIS — I9589 Other hypotension: Secondary | ICD-10-CM | POA: Diagnosis present

## 2022-05-08 DIAGNOSIS — D696 Thrombocytopenia, unspecified: Secondary | ICD-10-CM | POA: Diagnosis present

## 2022-05-08 DIAGNOSIS — J9621 Acute and chronic respiratory failure with hypoxia: Secondary | ICD-10-CM | POA: Diagnosis present

## 2022-05-08 DIAGNOSIS — E871 Hypo-osmolality and hyponatremia: Secondary | ICD-10-CM | POA: Diagnosis present

## 2022-05-08 DIAGNOSIS — K920 Hematemesis: Secondary | ICD-10-CM | POA: Diagnosis present

## 2022-05-08 DIAGNOSIS — R579 Shock, unspecified: Secondary | ICD-10-CM | POA: Diagnosis present

## 2022-05-08 DIAGNOSIS — G9341 Metabolic encephalopathy: Secondary | ICD-10-CM | POA: Diagnosis present

## 2022-05-08 DIAGNOSIS — B964 Proteus (mirabilis) (morganii) as the cause of diseases classified elsewhere: Secondary | ICD-10-CM | POA: Diagnosis present

## 2022-05-08 DIAGNOSIS — J449 Chronic obstructive pulmonary disease, unspecified: Secondary | ICD-10-CM | POA: Diagnosis present

## 2022-05-08 DIAGNOSIS — Z9981 Dependence on supplemental oxygen: Secondary | ICD-10-CM

## 2022-05-08 DIAGNOSIS — E861 Hypovolemia: Secondary | ICD-10-CM | POA: Diagnosis present

## 2022-05-08 DIAGNOSIS — J988 Other specified respiratory disorders: Secondary | ICD-10-CM | POA: Diagnosis not present

## 2022-05-08 DIAGNOSIS — Z87891 Personal history of nicotine dependence: Secondary | ICD-10-CM

## 2022-05-08 DIAGNOSIS — Z6821 Body mass index (BMI) 21.0-21.9, adult: Secondary | ICD-10-CM

## 2022-05-08 DIAGNOSIS — L89322 Pressure ulcer of left buttock, stage 2: Secondary | ICD-10-CM | POA: Diagnosis present

## 2022-05-08 DIAGNOSIS — L89520 Pressure ulcer of left ankle, unstageable: Secondary | ICD-10-CM | POA: Diagnosis not present

## 2022-05-08 DIAGNOSIS — Z781 Physical restraint status: Secondary | ICD-10-CM | POA: Diagnosis not present

## 2022-05-08 DIAGNOSIS — R739 Hyperglycemia, unspecified: Secondary | ICD-10-CM | POA: Diagnosis present

## 2022-05-08 DIAGNOSIS — B871 Wound myiasis: Secondary | ICD-10-CM | POA: Diagnosis present

## 2022-05-08 LAB — URINALYSIS, ROUTINE W REFLEX MICROSCOPIC
Bilirubin Urine: NEGATIVE
Glucose, UA: 50 mg/dL — AB
Ketones, ur: 20 mg/dL — AB
Leukocytes,Ua: NEGATIVE
Nitrite: NEGATIVE
Protein, ur: 30 mg/dL — AB
Specific Gravity, Urine: 1.017 (ref 1.005–1.030)
pH: 5 (ref 5.0–8.0)

## 2022-05-08 LAB — COMPREHENSIVE METABOLIC PANEL
ALT: 34 U/L (ref 0–44)
AST: 35 U/L (ref 15–41)
Albumin: 2.3 g/dL — ABNORMAL LOW (ref 3.5–5.0)
Alkaline Phosphatase: 87 U/L (ref 38–126)
Anion gap: 9 (ref 5–15)
BUN: 56 mg/dL — ABNORMAL HIGH (ref 6–20)
CO2: 23 mmol/L (ref 22–32)
Calcium: 7.3 mg/dL — ABNORMAL LOW (ref 8.9–10.3)
Chloride: 105 mmol/L (ref 98–111)
Creatinine, Ser: 1.34 mg/dL — ABNORMAL HIGH (ref 0.44–1.00)
GFR, Estimated: 49 mL/min — ABNORMAL LOW (ref >60)
Glucose, Bld: 197 mg/dL — ABNORMAL HIGH (ref 70–99)
Potassium: 2.6 mmol/L — CL (ref 3.5–5.1)
Sodium: 137 mmol/L (ref 135–145)
Total Bilirubin: 0.9 mg/dL (ref 0.3–1.2)
Total Protein: 5.7 g/dL — ABNORMAL LOW (ref 6.5–8.1)

## 2022-05-08 LAB — BLOOD GAS, ARTERIAL
Acid-base deficit: 4.8 mmol/L — ABNORMAL HIGH (ref 0.0–2.0)
Bicarbonate: 22 mmol/L (ref 20.0–28.0)
O2 Saturation: 99.6 %
Patient temperature: 37
pCO2 arterial: 48 mmHg (ref 32–48)
pH, Arterial: 7.27 — ABNORMAL LOW (ref 7.35–7.45)
pO2, Arterial: 442 mmHg — ABNORMAL HIGH (ref 83–108)

## 2022-05-08 LAB — CBC WITH DIFFERENTIAL/PLATELET
Abs Immature Granulocytes: 0.2 10*3/uL — ABNORMAL HIGH (ref 0.00–0.07)
Basophils Absolute: 0 10*3/uL (ref 0.0–0.1)
Basophils Relative: 0 %
Eosinophils Absolute: 0 10*3/uL (ref 0.0–0.5)
Eosinophils Relative: 0 %
HCT: 27 % — ABNORMAL LOW (ref 36.0–46.0)
Hemoglobin: 9.1 g/dL — ABNORMAL LOW (ref 12.0–15.0)
Immature Granulocytes: 2 %
Lymphocytes Relative: 15 %
Lymphs Abs: 1.7 10*3/uL (ref 0.7–4.0)
MCH: 33.7 pg (ref 26.0–34.0)
MCHC: 33.7 g/dL (ref 30.0–36.0)
MCV: 100 fL (ref 80.0–100.0)
Monocytes Absolute: 0.5 10*3/uL (ref 0.1–1.0)
Monocytes Relative: 5 %
Neutro Abs: 8.9 10*3/uL — ABNORMAL HIGH (ref 1.7–7.7)
Neutrophils Relative %: 78 %
Platelets: 173 10*3/uL (ref 150–400)
RBC: 2.7 MIL/uL — ABNORMAL LOW (ref 3.87–5.11)
RDW: 15.1 % (ref 11.5–15.5)
WBC: 11.3 10*3/uL — ABNORMAL HIGH (ref 4.0–10.5)
nRBC: 0.7 % — ABNORMAL HIGH (ref 0.0–0.2)

## 2022-05-08 LAB — MAGNESIUM: Magnesium: 2.6 mg/dL — ABNORMAL HIGH (ref 1.7–2.4)

## 2022-05-08 LAB — AMMONIA: Ammonia: 15 umol/L (ref 9–35)

## 2022-05-08 LAB — MRSA NEXT GEN BY PCR, NASAL: MRSA by PCR Next Gen: NOT DETECTED

## 2022-05-08 LAB — RAPID URINE DRUG SCREEN, HOSP PERFORMED
Amphetamines: NOT DETECTED
Barbiturates: NOT DETECTED
Benzodiazepines: POSITIVE — AB
Cocaine: NOT DETECTED
Opiates: NOT DETECTED
Tetrahydrocannabinol: NOT DETECTED

## 2022-05-08 LAB — PROTIME-INR
INR: 1.2 (ref 0.8–1.2)
Prothrombin Time: 14.7 seconds (ref 11.4–15.2)

## 2022-05-08 LAB — BRAIN NATRIURETIC PEPTIDE: B Natriuretic Peptide: 64.1 pg/mL (ref 0.0–100.0)

## 2022-05-08 LAB — LIPASE, BLOOD: Lipase: 84 U/L — ABNORMAL HIGH (ref 11–51)

## 2022-05-08 LAB — ETHANOL: Alcohol, Ethyl (B): <10 mg/dL (ref <10)

## 2022-05-08 LAB — LACTIC ACID, PLASMA: Lactic Acid, Venous: 0.9 mmol/L (ref 0.5–1.9)

## 2022-05-08 MED ORDER — CHLORHEXIDINE GLUCONATE CLOTH 2 % EX PADS
6.0000 | MEDICATED_PAD | Freq: Every day | CUTANEOUS | Status: DC
  Administered 2022-05-08 – 2022-05-11 (×4): 6 via TOPICAL

## 2022-05-08 MED ORDER — IPRATROPIUM-ALBUTEROL 0.5-2.5 (3) MG/3ML IN SOLN: 3.0000 mL | Freq: Four times a day (QID) | RESPIRATORY_TRACT | Status: DC | PRN

## 2022-05-08 MED ORDER — CHLORHEXIDINE GLUCONATE 0.12% ORAL RINSE (MEDLINE KIT)
15.0000 mL | Freq: Two times a day (BID) | OROMUCOSAL | Status: DC
  Administered 2022-05-08 – 2022-05-11 (×7): 15 mL via OROMUCOSAL

## 2022-05-08 MED ORDER — ONDANSETRON HCL 4 MG/2ML IJ SOLN: 4.0000 mg | Freq: Four times a day (QID) | INTRAMUSCULAR | Status: DC | PRN

## 2022-05-08 MED ORDER — PROCHLORPERAZINE EDISYLATE 10 MG/2ML IJ SOLN: 5.0000 mg | Freq: Four times a day (QID) | INTRAMUSCULAR | Status: DC | PRN

## 2022-05-08 MED ORDER — LORAZEPAM 2 MG/ML IJ SOLN
1.0000 mg | INTRAMUSCULAR | Status: DC | PRN
  Administered 2022-05-09 – 2022-05-13 (×11): 2 mg via INTRAVENOUS
  Filled 2022-05-08 (×11): qty 1

## 2022-05-08 MED ORDER — PANTOPRAZOLE 80MG IVPB - SIMPLE MED
80.0000 mg | Freq: Once | INTRAVENOUS | Status: AC
  Administered 2022-05-08: 80 mg via INTRAVENOUS
  Filled 2022-05-08: qty 80

## 2022-05-08 MED ORDER — ORAL CARE MOUTH RINSE
15.0000 mL | OROMUCOSAL | Status: DC
  Administered 2022-05-08 – 2022-05-11 (×31): 15 mL via OROMUCOSAL

## 2022-05-08 MED ORDER — IPRATROPIUM-ALBUTEROL 0.5-2.5 (3) MG/3ML IN SOLN
3.0000 mL | Freq: Four times a day (QID) | RESPIRATORY_TRACT | Status: DC
  Administered 2022-05-08 – 2022-05-09 (×3): 3 mL via RESPIRATORY_TRACT
  Filled 2022-05-08 (×3): qty 3

## 2022-05-08 MED ORDER — SODIUM CHLORIDE 0.9 % IV SOLN: 3.0000 g | Freq: Two times a day (BID) | INTRAVENOUS | Status: DC

## 2022-05-08 MED ORDER — SODIUM CHLORIDE 0.9 % IV SOLN
250.0000 mL | INTRAVENOUS | Status: DC
  Administered 2022-05-08: 250 mL via INTRAVENOUS

## 2022-05-08 MED ORDER — SODIUM CHLORIDE 0.9 % IV SOLN: 1.0000 mg | Freq: Every day | INTRAVENOUS | Status: DC

## 2022-05-08 MED ORDER — SODIUM CHLORIDE 0.9 % IV SOLN
3.0000 g | Freq: Four times a day (QID) | INTRAVENOUS | Status: AC
  Administered 2022-05-09 – 2022-05-13 (×20): 3 g via INTRAVENOUS
  Filled 2022-05-08 (×20): qty 8

## 2022-05-08 MED ORDER — POTASSIUM CHLORIDE 10 MEQ/50ML IV SOLN
10.0000 meq | INTRAVENOUS | Status: AC
  Administered 2022-05-09 (×6): 10 meq via INTRAVENOUS
  Filled 2022-05-08 (×6): qty 50

## 2022-05-08 MED ORDER — SODIUM CHLORIDE 0.9 % IV SOLN
50.0000 ug/h | INTRAVENOUS | Status: DC
  Administered 2022-05-08 – 2022-05-10 (×4): 50 ug/h via INTRAVENOUS
  Filled 2022-05-08 (×5): qty 1

## 2022-05-08 MED ORDER — INSULIN ASPART 100 UNIT/ML IJ SOLN
0.0000 [IU] | INTRAMUSCULAR | Status: DC
  Administered 2022-05-08: 3 [IU] via SUBCUTANEOUS
  Administered 2022-05-09: 2 [IU] via SUBCUTANEOUS
  Administered 2022-05-09 (×2): 3 [IU] via SUBCUTANEOUS
  Administered 2022-05-09: 5 [IU] via SUBCUTANEOUS
  Administered 2022-05-09: 3 [IU] via SUBCUTANEOUS
  Administered 2022-05-10 (×4): 2 [IU] via SUBCUTANEOUS
  Administered 2022-05-10 – 2022-05-11 (×3): 3 [IU] via SUBCUTANEOUS
  Administered 2022-05-11 – 2022-05-14 (×11): 2 [IU] via SUBCUTANEOUS
  Administered 2022-05-14: 3 [IU] via SUBCUTANEOUS
  Administered 2022-05-14 – 2022-05-15 (×2): 2 [IU] via SUBCUTANEOUS

## 2022-05-08 MED ORDER — POLYETHYLENE GLYCOL 3350 17 G PO PACK: 17.0000 g | PACK | Freq: Every day | ORAL | Status: DC | PRN

## 2022-05-08 MED ORDER — DEXMEDETOMIDINE HCL IN NACL 200 MCG/50ML IV SOLN
0.2000 ug/kg/h | INTRAVENOUS | Status: DC
  Administered 2022-05-08: 0.2 ug/kg/h via INTRAVENOUS
  Administered 2022-05-09 (×3): 0.6 ug/kg/h via INTRAVENOUS
  Filled 2022-05-08 (×4): qty 50

## 2022-05-08 MED ORDER — FENTANYL 2500MCG IN NS 250ML (10MCG/ML) PREMIX INFUSION
0.0000 ug/h | INTRAVENOUS | Status: DC
  Administered 2022-05-08: 25 ug/h via INTRAVENOUS
  Administered 2022-05-10: 100 ug/h via INTRAVENOUS
  Filled 2022-05-08 (×2): qty 250

## 2022-05-08 MED ORDER — FOLIC ACID 5 MG/ML IJ SOLN
1.0000 mg | Freq: Every day | INTRAMUSCULAR | Status: DC
  Administered 2022-05-08 – 2022-05-09 (×2): 1 mg via INTRAVENOUS
  Filled 2022-05-08 (×3): qty 0.2

## 2022-05-08 MED ORDER — NOREPINEPHRINE 4 MG/250ML-% IV SOLN
5.0000 ug/min | INTRAVENOUS | Status: DC
  Administered 2022-05-08: 11 ug/min via INTRAVENOUS
  Administered 2022-05-09: 21 ug/min via INTRAVENOUS
  Administered 2022-05-09: 10 ug/min via INTRAVENOUS
  Administered 2022-05-09: 20 ug/min via INTRAVENOUS
  Administered 2022-05-09: 19 ug/min via INTRAVENOUS
  Filled 2022-05-08 (×4): qty 250

## 2022-05-08 MED ORDER — FENTANYL CITRATE (PF) 100 MCG/2ML IJ SOLN
100.0000 ug | Freq: Once | INTRAMUSCULAR | Status: AC
  Administered 2022-05-08: 100 ug via INTRAVENOUS

## 2022-05-08 MED ORDER — POTASSIUM CHLORIDE 10 MEQ/50ML IV SOLN: 10.0000 meq | INTRAVENOUS | Status: DC

## 2022-05-08 MED ORDER — SODIUM CHLORIDE 0.9 % IV SOLN
3.0000 g | INTRAVENOUS | Status: AC
  Administered 2022-05-08: 3 g via INTRAVENOUS
  Filled 2022-05-08: qty 8

## 2022-05-08 MED ORDER — PANTOPRAZOLE SODIUM 40 MG IV SOLR
40.0000 mg | Freq: Two times a day (BID) | INTRAVENOUS | Status: DC
  Administered 2022-05-08 – 2022-05-15 (×15): 40 mg via INTRAVENOUS
  Filled 2022-05-08 (×15): qty 10

## 2022-05-08 MED ORDER — DOCUSATE SODIUM 100 MG PO CAPS: 100.0000 mg | ORAL_CAPSULE | Freq: Two times a day (BID) | ORAL | Status: DC | PRN

## 2022-05-08 MED ORDER — THIAMINE HCL 100 MG/ML IJ SOLN
100.0000 mg | Freq: Every day | INTRAMUSCULAR | Status: DC
  Administered 2022-05-08 – 2022-05-09 (×2): 100 mg via INTRAVENOUS
  Filled 2022-05-08 (×2): qty 2

## 2022-05-08 NOTE — Progress Notes (Signed)
RSI kit was emergently overridden for this patient to be intubated before the patient was able to be moved over into Pyxis. 27m of Etomidate, 866mof ROC, 10072mof Fentanyl, & 2mg82m versed as verbally ordered by CCM MD GonzPatsey Berthold ?TroiRenae Gloss ? ?

## 2022-05-08 NOTE — Progress Notes (Addendum)
eLink Physician-Brief Progress Note ?Patient Name: Katie Young ?DOB: August 05, 1976 ?MRN: [id number] ? ? ?Date of Service ? 05/08/2022  ?HPI/Events of Note ? 46/F with hx of tobacco and alcohol abuse, brought in due to altered mental status and hematemesis.  She was profoundly hypotensive and started on vasopressors after IVFs. Pt intubated for airway protection.  ? ?Pt had upper endoscopy in 12/2021 showing esophagitis, no varices.  ? ?BP 112/65, HR 79, RR 16, O2 sats 100% ? ?H&H 12.2/36  ?eICU Interventions ? UGIB ?Acute respiratory failure ?Encephalopathy ?Shock ? ?Protonix 40mg  IV BID.  ?Check ABG post-intubation.  ?Continue empiric antibiotics. Check urinalysis. No acute infiltrates on CXR.  ?Follow up drug screen, alcohol, ammonia level.  ?Get CT head.  ?SCDs for DVT prophylaxis.   ? ? ? ?Intervention Category ?Evaluation Type: New Patient Evaluation ? ? ?05/08/2022, 7:30 PM ? ?11:05 PM ?Notified of Qtc 570.  ? ?Plan> ?Hold zofran.  Switch to prochlorperazine prn for nausea.  ? ?3:27 AM ?Notified of CVP 7-8 with increased levophed requirements.  Pt with 350cc urine output this evening.  ?BP 106/53, HR 83, RR 18, O2 sats 92%. ? ?Plan> ?Ok to give IVF bolus - 500cc LR bolus ordered.  ?

## 2022-05-08 NOTE — Procedures (Signed)
Intubation Procedure Note ? ?Katie Young  ?[id number]  ?Oct 02, 1976 ? ?Date:05/08/22  ?Time:10:17 PM  ? ?Provider Performing:Feleshia Zundel Duwayne Heck  ? ? ?Procedure: Intubation ([id number]) ? ?Indication(s) ?Respiratory Failure ? ?Consent ?Risks of the procedure as well as the alternatives and risks of each were explained to the patient and/or caregiver.  Consent for the procedure was obtained and is signed in the bedside chart ? ?Daughter Katie Young consented via telephone ? ?Anesthesia ?Etomidate and Rocuronium ? ? ?Time Out ?Verified patient identification, verified procedure, site/side was marked, verified correct patient position, special equipment/implants available, medications/allergies/relevant history reviewed, required imaging and test results available. ? ? ?Sterile Technique ?Usual hand hygeine, masks, and gloves were used ? ? ?Procedure Description ?Patient positioned in bed supine.  Sedation given as noted above.  Patient was intubated with endotracheal tube using Glidescope.  View was Grade 1 full glottis .  Number of attempts was 1.  Colorimetric CO2 detector was consistent with tracheal placement. ?Some mucous in posterior oropharynx.  ? ?Complications/Tolerance ?None; patient tolerated the procedure well. ?Chest X-ray is ordered to verify placement. ? ? ?EBL ?Minimal ? ? ?Specimen(s) ?None ? ?

## 2022-05-08 NOTE — Progress Notes (Addendum)
eLink Physician-Brief Progress Note ?Patient Name: Katie Young ?DOB: [birth year] ?MRN: [id number] ? ? ?Date of Service ? 05/08/2022  ?HPI/Events of Note ? Notified of K 2.6, crea 1.34.  ?  ?eICU Interventions ? Replete K - IV x 6 doses.   ? ? ? ?Intervention Category ?Minor Interventions: Electrolytes abnormality - evaluation and management ? ?Katie Young ?05/08/2022, 11:40 PM ? ?6:13 AM ?Notified that CVP remains at 7 after 500cc IVF bolus given earlier.  ? ?K also remains at 2.8 <-- 2.6. Crea 1.12. Pt is still finishing Kcl orders from earlier. ? ?Plan> ?Give 1L LR bolus.  ?Check K after IV is given. ?

## 2022-05-08 NOTE — Progress Notes (Signed)
Pharmacy Antibiotic Note ? ?Katie Young is a 46 y.o. female transferred from [facility] to [facility] on 05/08/2022 with altered mental status, hematemesis, hypotension. Pharmacy has been consulted for Unasyn dosing for aspiration pneumonia. Patient received Cefepime 1g IV at 1449 and Vancomycin 1g IV at 1509. SCr at [facility] 2.49. CrCl ~ 29 ml/min. ? ?05/08/22  3rd shift update: ?22:35 SCr = 1.34 with Crcl ~ 53 ml/min ? ?Plan: ?Change Unasyn to 3g IV q6h ?F/u updated labs, cultures, clinical course ? ?Height: 5\' 9"  (175.3 cm) ?Weight: 64.8 kg (142 lb 13.7 oz) ?IBW/kg (Calculated) : 66.2 ? ? ?Allergies  ?Allergen Reactions  ? Sulfa Antibiotics Nausea And Vomiting  ? ? ?Antimicrobials this admission: ?5/14 Vancomycin x 1 ?5/14 Cefepime x 1 ?5/14 Unasyn >> ? ?Dose adjustments this admission: ?-- ? ?Microbiology results: ?5/14 BCx:  ?5/14 Tracheal aspirate:  ?5/14 MRSA PCR: negative  ?5/14 Strep pneumo urinary antigen: ?5/14 Legionella urinary antigen:  ? ?Thank you for allowing pharmacy to be a part of this patient?s care. ? ? ?Leone Haven, PharmD ?05/08/2022 11:46 PM ? ?
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
## 2022-05-08 NOTE — H&P (Addendum)
? ?NAME:  Katie Young, MRN:  [id number], DOB:  11/25/1976, LOS: 0 ?ADMISSION DATE:  (Not on file), CONSULTATION DATE:  05/08/22 ?REFERRING MD:  Romie Minus, MD, CHIEF COMPLAINT:  GI Bleed  ? ?History of Present Illness:  ?46 year old female with current tobacco/EtOH abuse who presents with altered mental status and reported hematemesis described as black blood x 2 days by the family who presented to [facility]. No further episodes of hematemesis on arrival however was profoundly hypotensive to the 50s. She received 3L IVF and started of vasopressors. R IJ central line placed. Last Hg 10.8, similar to prior labs she had there. K 3.1. BUN/Cr 78/2.49. AST 47 ALT 48 AP 127. PT/INR 11/1.78EU note she has had an EGD with [facility] in [place] in 12/2021 that demonstrated esophagitis but no varices or ulcers.   ? ?Per her daughter Marcelino Duster, she lives with her boyfriend.  Marcelino Duster had not seen her in several weeks.  Was recently in rehab and discharged to home about 2 months ago.  Unfortunately she started drinking heavily again after her discharge.  Today when Marcelino Duster went to check on her she found her covered in black coffee ground emesis and lethargic.  She was awake enough  to refuse transfer to the ED from paramedics at that time.   ? ?Upper endoscopy 1/23: esophageal stenosis and esophagitis.  ?Pertinent  Medical History  ?EtOH use, CKD, anxiety, hx stroke, COPD on oxygen, DM2, HLD, HTN, GERD ? ?Significant Hospital Events: ?Including procedures, antibiotic start and stop dates in addition to other pertinent events   ?5/14 Admitted to [facility] from [facility] for GI bleed ? ?Meds: albuterol, vit C, Asa 81, Atorvastatin 10mg  , buspar 10 mg TID, colace, folate, gabapentin 400TID, hydroxyzine prn, protonix 40 BID, thiamine, trazodone 50 qHS ?Interim History / Subjective:  ?As above ? ?Objective   ?Last menstrual period 03/09/2014. ?PAP: ()/()   ?   ?No intake or output data in the 24 hours ending 05/08/22 1717 ?There  were no vitals filed for this visit. ? ?Examination: ?General: Opens eyes briefly to sternal rub.  ?HENT: PERRL, MMM, black material on lips/mouth, poor dentition ?Lungs: rhonchi on R, no wheeze ?Cardiovascular: RRR no mgr  ?Abdomen: NT, ND, NBS  ?Extremities: Skin breakdown and thinning on Hips, dressed wound on L ankle  ?Neuro: lethargic, minimally responsive, opens eyes to sternal rub only  ?GU: foley ? ?Resolved Hospital Problem list   ?N/A ? ?Assessment & Plan:  ?Acute metabolic encephalopathy:  ?Intoxication vs sepsis vs hepatic encephalopathy?  ?Ammonia, pending, recheck abg, CT head pending.   ?Intubated for airway protection.  ? ?Hematemesis secondary to suspected upper GI bleed ?Wean vasopressors for goal >65 ?Type and cross ?Trend CBC. Transfuse for Hg >7 or active bleed ?PPI, octreotide.  GI consult in AM, Sooner if needed.  ?Trend LA ? ?Hypotension:  ?Hypovolemia vs sepsis vs acute bleeding.  ?Hb not terribly low initially, will recheck now that she has been hydrated.   ?No active bleeding while in the ED today.  ?Checking lactic and procalcitonin.  ?Starting unasyn for possible aspiration PNA given rhonchi on R.  ? ?Acute hypoxemic respiratory failure ?COPD on home O2 ?Tobacco abuse ?Intubated for airway protection.  ?Actually did desat briefly on arrival while on 4 L, 100% on 15LHFNC  ?Intubated, no desaturation.  ?Bronchodilators ? ?Acute on chronic kidney injury ?Monitor UOP/Cr ?Avoid nephrotoxic agents ? ?Hypokalemia ?Replete as needed.  ? ?Acute metabolic encephalopathy ?Hx EtOH abuse ?Hx  seizures since childhood ?OSH UDS neg. EtOH <3 ?CIWA ?On precedex.  ? ?DM2 ? ?Hx chronic LLE wound that has debrided with maggots ?Best Practice (right click and "Reselect all SmartList Selections" daily)  ? ?Diet/type: NPO ?DVT prophylaxis: other contraindicated ?GI prophylaxis: PPI ?Lines: Central line ?Foley:  Yes, and it is still needed ?Code Status:  full code ?Last date of multidisciplinary goals of care  discussion ] ? ?Labs   ?CBC: ?No results for input(s): WBC, NEUTROABS, HGB, HCT, MCV, PLT in the last 168 hours. ? ?Basic Metabolic Panel: ?No results for input(s): NA, K, CL, CO2, GLUCOSE, BUN, CREATININE, CALCIUM, MG, PHOS in the last 168 hours. ?GFR: ?CrCl cannot be calculated (Patient's most recent lab result is older than the maximum 21 days allowed.). ?No results for input(s): PROCALCITON, WBC, LATICACIDVEN in the last 168 hours. ? ?Liver Function Tests: ?No results for input(s): AST, ALT, ALKPHOS, BILITOT, PROT, ALBUMIN in the last 168 hours. ?No results for input(s): LIPASE, AMYLASE in the last 168 hours. ?No results for input(s): AMMONIA in the last 168 hours. ? ?ABG ?   ?Component Value Date/Time  ? TCO2 28 12/24/2021 1000  ?  ? ?Coagulation Profile: ?No results for input(s): INR, PROTIME in the last 168 hours. ? ?Cardiac Enzymes: ?No results for input(s): CKTOTAL, CKMB, CKMBINDEX, TROPONINI in the last 168 hours. ? ?HbA1C: ?Hgb A1c MFr Bld  ?Date/Time Value Ref Range Status  ?06/01/2009 09:55 AM 5.3 %   ? ? ?CBG: ?No results for input(s): GLUCAP in the last 168 hours. ? ?Review of Systems:   ?Unable due to mental status ? ? ?Past Medical History:  ?She,  has a past medical history of Chronic kidney disease, COPD (chronic obstructive pulmonary disease) (HCC), Dysphagia, and Lower leg fracture.  ? ?Surgical History:  ? ?Past Surgical History:  ?Procedure Laterality Date  ? ABDOMINAL AORTOGRAM W/LOWER EXTREMITY N/A 12/24/2021  ? Procedure: ABDOMINAL AORTOGRAM W/LOWER EXTREMITY;  Surgeon: Leonie DouglasHawken, Thomas N, MD;  Location: [facility] INVASIVE CV LAB;  Service: Cardiovascular;  Laterality: N/A;  ? BIOPSY  12/31/2021  ? Procedure: BIOPSY;  Surgeon: Marguerita Merlesastaneda Mayorga, Reuel Boomaniel, MD;  Location: [facility] ENDO SUITE;  Service: Gastroenterology;;  ? COLONOSCOPY WITH PROPOFOL N/A 12/31/2021  ? Procedure: COLONOSCOPY WITH PROPOFOL;  Surgeon: Dolores Frameastaneda Mayorga, Daniel, MD;  Location: [facility] END[facility];  Service: Gastroenterology;  Laterality:  N/A;  815  ? ESOPHAGEAL DILATION  12/31/2021  ? Procedure: ESOPHAGEAL DILATION;  Surgeon: Marguerita Merlesastaneda Mayorga, Reuel Boomaniel, MD;  Location: [facility];  Service: Gastroenterology;;  ? ESOPHAGOGASTRODUODENOSCOPY (EGD) WITH PROPOFOL N/A 12/31/2021  ? Procedure: ESOPHAGOGASTRODUODENOSCOPY (EGD) WITH PROPOFOL;  Surgeon: Dolores Frameastaneda Mayorga, Daniel, MD;  Location: [facility];  Service: Gastroenterology;  Laterality: N/A;  ? FRACTURE SURGERY    ? TUBAL LIGATION    ?  ? ?Social History:  ? reports that she has quit smoking. Her smoking use included cigarettes. She smoked an average of 1 pack per day. She does not have any smokeless tobacco history on file. She reports current alcohol use. She reports that she does not use drugs.  ? ?Family History:  ?Her family history is not on file.  ? ?Allergies ?Allergies  ?Allergen Reactions  ? Sulfa Antibiotics Nausea And Vomiting  ?  ? ?Home Medications  ?Prior to Admission medications   ?Medication Sig Start Date End Date Taking? Authorizing Provider  ?acetaminophen (TYLENOL) 325 MG tablet Take 650 mg by mouth every 6 (six) hours as needed for fever (Pain).    [provider]  ?  albuterol (VENTOLIN HFA) 108 (90 Base) MCG/ACT inhaler Inhale 2 puffs into the lungs every 6 (six) hours as needed for wheezing or shortness of breath.    [provider]  ?ascorbic acid (VITAMIN C) 500 MG tablet Take 1,000 mg by mouth 2 (two) times daily.    [provider]  ?aspirin 81 MG EC tablet Take 1 tablet (81 mg total) by mouth daily. 12/24/21 12/24/22  Leonie Douglas, MD  ?atorvastatin (LIPITOR) 10 MG tablet Take 1 tablet (10 mg total) by mouth daily. 12/24/21 12/24/22  Leonie Douglas, MD  ?busPIRone (BUSPAR) 10 MG tablet Take 10 mg by mouth 3 (three) times daily. 11/25/21   [provider]  ?docusate sodium (COLACE) 100 MG capsule Take 100 mg by mouth daily.    [provider]  ?famotidine (PEPCID) 20 MG tablet Take 20 mg by mouth daily.    [provider]  ?folic acid (FOLVITE) 1 MG tablet Take 1 mg by mouth daily. 12/05/21   [provider]  ?gabapentin (NEURONTIN) 400 MG capsule Take 400 mg by mouth 3 (three) times daily. 12/05/21   Pro

## 2022-05-08 NOTE — Progress Notes (Signed)
Date and time results received: 05/08/22 2314 ? ? ?Test: potassium ?Critical Value: 2.6 ? ?Name of Provider Notified: E-Link MD ? ?Orders Received? Or Actions Taken?:  Awaiting new orders  ?

## 2022-05-08 NOTE — Progress Notes (Signed)
Pharmacy Antibiotic Note ? ?Katie Young is a 46 y.o. female transferred from [facility] to [facility] on 05/08/2022 with altered mental status, hematemesis, hypotension. Pharmacy has been consulted for Unasyn dosing for aspiration pneumonia. Patient received Cefepime 1g IV at 1449 and Vancomycin 1g IV at 1509. SCr at [facility] 2.49. CrCl ~ 29 ml/min. ? ?Plan: ?Unasyn 3g IV q12h ?F/u updated labs, cultures, clinical course ? ?Height: 5\' 9"  (175.3 cm) ?Weight: 64.8 kg (142 lb 13.7 oz) ?IBW/kg (Calculated) : 66.2 ? ? ?Allergies  ?Allergen Reactions  ? Sulfa Antibiotics Nausea And Vomiting  ? ? ?Antimicrobials this admission: ?5/14 Vancomycin x 1 ?5/14 Cefepime x 1 ?5/14 Unasyn >> ? ?Dose adjustments this admission: ?-- ? ?Microbiology results: ?5/14 BCx:  ?5/14 Tracheal aspirate:  ?5/14 MRSA PCR: negative  ?5/14 Strep pneumo urinary antigen: ?5/14 Legionella urinary antigen:  ? ?Thank you for allowing pharmacy to be a part of this patient?s care. ? ? ?6/14, PharmD, BCPS ?Clinical Pharmacist  ?05/08/2022 9:48 PM ? ?

## 2022-05-09 ENCOUNTER — Other Ambulatory Visit: Payer: Self-pay

## 2022-05-09 DIAGNOSIS — L8990 Pressure ulcer of unspecified site, unspecified stage: Secondary | ICD-10-CM | POA: Insufficient documentation

## 2022-05-09 DIAGNOSIS — K922 Gastrointestinal hemorrhage, unspecified: Secondary | ICD-10-CM | POA: Diagnosis not present

## 2022-05-09 DIAGNOSIS — E440 Moderate protein-calorie malnutrition: Secondary | ICD-10-CM | POA: Insufficient documentation

## 2022-05-09 LAB — CBC
HCT: 25.6 % — ABNORMAL LOW (ref 36.0–46.0)
Hemoglobin: 8.6 g/dL — ABNORMAL LOW (ref 12.0–15.0)
MCH: 33.7 pg (ref 26.0–34.0)
MCHC: 33.6 g/dL (ref 30.0–36.0)
MCV: 100.4 fL — ABNORMAL HIGH (ref 80.0–100.0)
Platelets: 168 K/uL (ref 150–400)
RBC: 2.55 MIL/uL — ABNORMAL LOW (ref 3.87–5.11)
RDW: 15.2 % (ref 11.5–15.5)
WBC: 12.4 K/uL — ABNORMAL HIGH (ref 4.0–10.5)
nRBC: 1.5 % — ABNORMAL HIGH (ref 0.0–0.2)

## 2022-05-09 LAB — BASIC METABOLIC PANEL
Anion gap: 6 (ref 5–15)
BUN: 30 mg/dL — ABNORMAL HIGH (ref 6–20)
CO2: 27 mmol/L (ref 22–32)
Calcium: 8.1 mg/dL — ABNORMAL LOW (ref 8.9–10.3)
Chloride: 110 mmol/L (ref 98–111)
Creatinine, Ser: 0.85 mg/dL (ref 0.44–1.00)
GFR, Estimated: >60 mL/min (ref >60)
Glucose, Bld: 125 mg/dL — ABNORMAL HIGH (ref 70–99)
Potassium: 2.9 mmol/L — ABNORMAL LOW (ref 3.5–5.1)
Sodium: 143 mmol/L (ref 135–145)

## 2022-05-09 LAB — MAGNESIUM: Magnesium: 2.3 mg/dL (ref 1.7–2.4)

## 2022-05-09 LAB — BASIC METABOLIC PANEL WITH GFR
Anion gap: 8 (ref 5–15)
Anion gap: 8 (ref 5–15)
BUN: 46 mg/dL — ABNORMAL HIGH (ref 6–20)
BUN: 40 mg/dL — ABNORMAL HIGH (ref 6–20)
CO2: 26 mmol/L (ref 22–32)
CO2: 25 mmol/L (ref 22–32)
Calcium: 7.5 mg/dL — ABNORMAL LOW (ref 8.9–10.3)
Calcium: 7.6 mg/dL — ABNORMAL LOW (ref 8.9–10.3)
Chloride: 103 mmol/L (ref 98–111)
Chloride: 105 mmol/L (ref 98–111)
Creatinine, Ser: 1.12 mg/dL — ABNORMAL HIGH (ref 0.44–1.00)
Creatinine, Ser: 1 mg/dL (ref 0.44–1.00)
GFR, Estimated: >60 mL/min
GFR, Estimated: >60 mL/min
Glucose, Bld: 179 mg/dL — ABNORMAL HIGH (ref 70–99)
Glucose, Bld: 150 mg/dL — ABNORMAL HIGH (ref 70–99)
Potassium: 2.8 mmol/L — ABNORMAL LOW (ref 3.5–5.1)
Potassium: 3.4 mmol/L — ABNORMAL LOW (ref 3.5–5.1)
Sodium: 137 mmol/L (ref 135–145)
Sodium: 138 mmol/L (ref 135–145)

## 2022-05-09 LAB — GLUCOSE, CAPILLARY
Glucose-Capillary: 161 mg/dL — ABNORMAL HIGH (ref 70–99)
Glucose-Capillary: 195 mg/dL — ABNORMAL HIGH (ref 70–99)
Glucose-Capillary: 242 mg/dL — ABNORMAL HIGH (ref 70–99)

## 2022-05-09 LAB — HIV ANTIBODY (ROUTINE TESTING W REFLEX): HIV Screen 4th Generation wRfx: NONREACTIVE

## 2022-05-09 LAB — CORTISOL: Cortisol, Plasma: 16.1 ug/dL

## 2022-05-09 LAB — TSH: TSH: 0.31 u[IU]/mL — ABNORMAL LOW (ref 0.350–4.500)

## 2022-05-09 LAB — HEMOGLOBIN A1C
Hgb A1c MFr Bld: 4.7 % — ABNORMAL LOW (ref 4.8–5.6)
Mean Plasma Glucose: 88.19 mg/dL

## 2022-05-09 LAB — PROCALCITONIN: Procalcitonin: <0.1 ng/mL

## 2022-05-09 LAB — STREP PNEUMONIAE URINARY ANTIGEN: Strep Pneumo Urinary Antigen: NEGATIVE

## 2022-05-09 LAB — HEMOGLOBIN AND HEMATOCRIT, BLOOD
HCT: 25.4 % — ABNORMAL LOW (ref 36.0–46.0)
Hemoglobin: 8.5 g/dL — ABNORMAL LOW (ref 12.0–15.0)

## 2022-05-09 MED ORDER — LACTATED RINGERS IV BOLUS
500.0000 mL | Freq: Once | INTRAVENOUS | Status: AC
  Administered 2022-05-09: 500 mL via INTRAVENOUS

## 2022-05-09 MED ORDER — FENTANYL CITRATE PF 50 MCG/ML IJ SOSY
50.0000 ug | PREFILLED_SYRINGE | INTRAMUSCULAR | Status: DC | PRN
  Administered 2022-05-09: 50 ug via INTRAVENOUS
  Administered 2022-05-10: 25 ug via INTRAVENOUS
  Administered 2022-05-10: 50 ug via INTRAVENOUS

## 2022-05-09 MED ORDER — REVEFENACIN 175 MCG/3ML IN SOLN
175.0000 ug | Freq: Every day | RESPIRATORY_TRACT | Status: DC
  Administered 2022-05-09 – 2022-05-18 (×10): 175 ug via RESPIRATORY_TRACT
  Filled 2022-05-09 (×11): qty 3

## 2022-05-09 MED ORDER — MIDAZOLAM HCL 2 MG/2ML IJ SOLN
1.0000 mg | INTRAMUSCULAR | Status: DC | PRN
  Administered 2022-05-11: 1 mg via INTRAVENOUS
  Filled 2022-05-09: qty 2

## 2022-05-09 MED ORDER — SODIUM CHLORIDE 0.9 % IV SOLN: INTRAVENOUS | Status: DC

## 2022-05-09 MED ORDER — ARFORMOTEROL TARTRATE 15 MCG/2ML IN NEBU
15.0000 ug | INHALATION_SOLUTION | Freq: Two times a day (BID) | RESPIRATORY_TRACT | Status: DC
  Administered 2022-05-09 – 2022-05-18 (×20): 15 ug via RESPIRATORY_TRACT
  Filled 2022-05-09 (×20): qty 2

## 2022-05-09 MED ORDER — ALBUTEROL SULFATE (2.5 MG/3ML) 0.083% IN NEBU: 2.5000 mg | INHALATION_SOLUTION | RESPIRATORY_TRACT | Status: DC | PRN

## 2022-05-09 MED ORDER — LACTATED RINGERS IV BOLUS
1000.0000 mL | Freq: Once | INTRAVENOUS | Status: AC
  Administered 2022-05-09: 1000 mL via INTRAVENOUS

## 2022-05-09 MED ORDER — NOREPINEPHRINE 16 MG/250ML-% IV SOLN
0.0000 ug/min | INTRAVENOUS | Status: DC
  Administered 2022-05-09: 18 ug/min via INTRAVENOUS
  Administered 2022-05-10: 10 ug/min via INTRAVENOUS
  Filled 2022-05-09 (×3): qty 250

## 2022-05-09 MED ORDER — POTASSIUM CHLORIDE 10 MEQ/50ML IV SOLN
10.0000 meq | INTRAVENOUS | Status: AC
  Administered 2022-05-09 (×6): 10 meq via INTRAVENOUS
  Filled 2022-05-09 (×6): qty 50

## 2022-05-09 NOTE — Progress Notes (Signed)
Sputum collected and send down to lab for analysis.  ?

## 2022-05-09 NOTE — Progress Notes (Signed)
Initial Nutrition Assessment ? ?DOCUMENTATION CODES:  ? ?Non-severe (moderate) malnutrition in context of social or environmental circumstances ? ?INTERVENTION:  ? ?Monitor magnesium, potassium, and phosphorus BID for at least 3 days, MD to replete as needed, as pt is at risk for refeeding syndrome. ? ?TF recommendations: ?-Initiate Vital AF 1.2 @ 20 ml/hr, advance by 10 ml every 12 hours to goal rate of 60 ml/hr via OGT. ?-This will provide 1728 kcals, 108g protein and 1167 ml H2O ? ?-1 packet Juven BID, each packet provides 95 calories, 2.5 grams of protein (collagen), and 9.8 grams of carbohydrate (3 grams sugar); also contains 7 grams of L-arginine and L-glutamine, 300 mg vitamin C, 15 mg vitamin E, 1.2 mcg vitamin B-12, 9.5 mg zinc, 200 mg calcium, and 1.5 g  Calcium Beta-hydroxy-Beta-methylbutyrate to support wound healing  ? ?-Will check iron anemia panel (including B-12), and Vitamin D given reports of heavy alcohol consumption. ? ?-Recommend supplementing with 500 mg Vitamin C BID and 220 mg Zinc sulfate daily for wound healing ? ?-Multivitamin with minerals daily ? ?NUTRITION DIAGNOSIS:  ? ?Moderate Malnutrition related to social / environmental circumstances (self-neglect, EtOH abuse) as evidenced by mild fat depletion, moderate muscle depletion. ? ?GOAL:  ? ?Patient will meet greater than or equal to 90% of their needs ? ?MONITOR:  ? ?Vent status, Labs, Weight trends, Skin, I & O's ? ?REASON FOR ASSESSMENT:  ? ?Malnutrition Screening Tool, Ventilator ?  ? ?ASSESSMENT:  ? ?46 year old female with current tobacco/EtOH abuse who presents with altered mental status and reported hematemesis described as black blood x 2 days by the family who presented to [facility]. Admitted for upper GI Bleed. ? ?Patient is currently intubated on ventilator support. OGT set to LIS. ?MV: 9.5 L/min ?Temp (24hrs), Avg:96.5 ?F (35.8 ?C), Min:91.7 ?F (33.2 ?C), Max:99.1 ?F (37.3 ?C) ? ?Patient sedated on vent. GI assessed pt  during visit.  ?Per chart review, pt was drinking heavily PTA. Family reported pt was found in emesis. Has not been eating well and does not have good hygiene. Maggots and roaches found in wound. ? ?Will leave TF recommendations above. ? ?Given history, at risk of multiple micronutrient deficiencies. Will order iron anemia panel and check Vitamin D. Noted that thiamine and folate already being supplemented. Will add MVI once able.  ? ?Per weight records, pt has lost 8 lbs since 01/07/22 (5% wt loss x 4 months, insignificant for time frame).  ? ?Medications: Folic acid, Thiamine, Precedex, Fentanyl, Levophed, Octreotide, KCl, Ativan ? ?Labs reviewed: ?CBGs: [id number] ?Low K ?UDS+ benzos ?  ? ?NUTRITION - FOCUSED PHYSICAL EXAM: ? ?Flowsheet Row Most Recent Value  ?Orbital Region Mild depletion  ?Upper Arm Region Moderate depletion  ?Thoracic and Lumbar Region No depletion  ?Buccal Region Unable to assess  ?Temple Region No depletion  ?Clavicle Bone Region No depletion  ?Clavicle and Acromion Bone Region No depletion  ?Scapular Bone Region Mild depletion  ?Dorsal Hand No depletion  ?Patellar Region Moderate depletion  ?Anterior Thigh Region Moderate depletion  ?Posterior Calf Region Unable to assess  ?Edema (RD Assessment) None  ?Hair Reviewed  ?Eyes Unable to assess  ?Mouth Unable to assess  ?Skin Reviewed  ? ?  ? ? ?Diet Order:   ?Diet Order   ? ?       ?  Diet NPO time specified  Diet effective now       ?  ? ?  ?  ? ?  ? ? ?EDUCATION  NEEDS:  ? ?Not appropriate for education at this time ? ?Skin:  Skin Assessment: Skin Integrity Issues: ?Skin Integrity Issues:: Unstageable, Other (Comment) ?Unstageable: right, left, buttocks ?Other: Non-pressure wound to LLE ? ?Last BM:  PTA ? ?Height:  ? ?Ht Readings from Last 1 Encounters:  ?05/08/22 5\' 9"  (1.753 m)  ? ? ?Weight:  ? ?Wt Readings from Last 1 Encounters:  ?05/09/22 64.8 kg  ? ? ?BMI:  Body mass index is 21.1 kg/m?. ? ?Estimated Nutritional Needs:  ? ?Kcal:   1608 ? ?Protein:  95-105g ? ?Fluid:  1.7L/day ? ? ?Clayton Bibles, MS, RD, LDN ?Inpatient Clinical Dietitian ?Contact information available via Amion ? ?

## 2022-05-09 NOTE — Progress Notes (Signed)
Q4 Continuous blood glucose checks not transmitting over into patients results. Will document blood glucose levels in MAR by leaving comments with insulin administration until this issue is resolved.  ?

## 2022-05-09 NOTE — Progress Notes (Signed)
? ?NAME:  Katie Young, MRN:  [id number], DOB:  [birth year], LOS: 1 ?ADMISSION DATE:  05/08/2022, CONSULTATION DATE:  05/08/22 ?REFERRING MD:  Romie Minus, MD, CHIEF COMPLAINT:  GI Bleed  ? ?History of Present Illness:  ?46 year old female with current tobacco/EtOH abuse who presents with altered mental status and reported hematemesis described as black blood x 2 days by the family who presented to [facility]. No further episodes of hematemesis on arrival however was profoundly hypotensive to the 50s. She received 3L IVF and started of vasopressors. R IJ central line placed. Last Hg 10.8, similar to prior labs she had there. K 3.1. BUN/Cr 78/2.49. AST 47 ALT 48 AP 127. PT/INR 11/1.08.  ? ?Of note she has had an EGD with [facility] in [place] in 12/2021 that demonstrated esophagitis but no varices or ulcers.   ? ?Per her daughter Marcelino Duster, she lives with her boyfriend.  Marcelino Duster had not seen her in several weeks.  Was recently in rehab and discharged to home about 2 months ago.  Unfortunately she started drinking heavily again after her discharge.  Today when Marcelino Duster went to check on her she found her covered in black coffee ground emesis and lethargic.  She was awake enough  to refuse transfer to the ED from paramedics at that time.   ? ?Pertinent  Medical History  ?EtOH use, CKD, anxiety, hx stroke, COPD on oxygen, DM2, HLD, HTN, GERD ? ?Significant Hospital Events: ?Including procedures, antibiotic start and stop dates in addition to other pertinent events   ?5/14 Admitted to [facility] from [facility] for GI bleed ?Head CT at OSH: No acute intracranial pathology. ?Possible sinusitis  ?5/15  Sedated on vent with no acute complications since admit overnight  ? ?Interim History / Subjective:  ?Sedated on vent  ?NO signs of bleeding overnight  ? ?Objective   ?Blood pressure (!) 93/54, pulse 85, temperature 99 ?F (37.2 ?C), temperature source Oral, resp. rate 18, height 5\' 9"  (1.753 m), weight 64.8 kg, last menstrual  period 03/09/2014, SpO2 98 %. ?CVP:  [4 mmHg-9 mmHg] 9 mmHg  ?Vent Mode: PRVC ?FiO2 (%):  [50 %-100 %] 50 % ?Set Rate:  [15 bmp-18 bmp] 18 bmp ?Vt Set:  [530 mL] 530 mL ?PEEP:  [5 cmH20] 5 cmH20 ?Plateau Pressure:  [20 cmH20-22 cmH20] 20 cmH20  ? ?Intake/Output Summary (Last 24 hours) at 05/09/2022 0728 ?Last data filed at 05/09/2022 0700 ?Gross per 24 hour  ?Intake 2419.69 ml  ?Output 700 ml  ?Net 1719.69 ml  ? ?Filed Weights  ? 05/08/22 1900 05/09/22 0434  ?Weight: 64.8 kg 64.8 kg  ? ? ?Examination: ?General: Acute on chronically ill appearing adult female lying in bed on mechanical ventilation, in NAD ?HEENT: ETT, MM pink/moist, PERRL,  ?Neuro: Sedated on vent  ?CV: s1s2 regular rate and rhythm, no murmur, rubs, or gallops,  ?PULM:  Clear to ascultation, no increased work of breathing, tolerating vent  ?GI: soft, bowel sounds active in all 4 quadrants, non-tender, non-distended, tolerating TF ?Extremities: warm/dry, no edema, left lower extremity wounds with dry dressing in place  ?Skin: no rashes or lesions ? ?Resolved Hospital Problem list   ?N/A ? ?Assessment & Plan:  ?Acute metabolic encephalopathy ?-Intoxication vs sepsis vs hepatic encephalopathy?  ?-Head CT at OSH negative  ?Hx seizures since childhood ?P: ?Maintain neuro protective measures; goal for eurothermia, euglycemia, eunatermia, normoxia, and PCO2 goal of 35-40 ?Nutrition and bowel regiment  ?Seizure precautions  ?Aspirations precautions  ?Minimize sedation as  able  ?Delirium precautions  ? ?Hematemesis secondary to suspected upper GI bleed ?-EGD with [facility] in [place] in 12/2021 that demonstrated esophagitis but no varices or ulcers.   ?Hx EtOH abuse ?P: ?Consult GI this am  ?Continue to monitor for further bleed  ?Trend CBC  ?Transfuse per protocol  ?Hgb goal > 7 ?Continue PPI BID and Octreotide drip  ?CIAW scale  ?Supplement Folate, Thiamine, and MVI  ? ?Acute hypoxemic respiratory failure ?-Concern for possible overdose/intoxication vs  aspiration event in the setting of GI bleed  ?COPD on home O2 ?Tobacco abuse ?Concern for aspiration PNA ?P: ?Continue ventilator support with lung protective strategies  ?Wean PEEP and FiO2 for sats greater than 90%. ?Head of bed elevated 30 degrees. ?Plateau pressures less than 30 cm H20.  ?Follow intermittent chest x-ray and ABG.   ?SAT/SBT as tolerated, mentation preclude extubation  ?Ensure adequate pulmonary hygiene  ?Follow cultures  ?VAP bundle in place  ?PAD protocol ?Continue empiric Unasyn  ? ?Hypotension  ?-Hypovolemia vs sepsis vs acute bleeding. Active infection with maggot infestation to left lower extremity  ?P: ?Continue pressors for MAP goal > 65 ?Consider images of LLE  ?Continue to monitor for signs of bleeding  ?Lactic WNL  ?Unasyn as above  ? ?Chronic left lower extremity osteomyelitis ?-Per chart review LLE wound dated back to 2013 after undergoing ORIF to ankle fracture   ?-Per chart review it appears that patient last saw wound care 10/05/2021 at which time Augmentin was prescribed and dressing orders placed   ?-On admission wound appeared to be infested with maggots  ?P: ?WOC consult  ?Local wound care  ?Consider images of leg  ?Antibiotics as above  ? ?Acute on chronic kidney failure  ?-Creatinine on admit 1.34 with GFR 49, Creatinine 66 with GFR >90 03/2014 ?P: ?Follow renal function  ?Monitor urine output ?Trend Bmet ?Avoid nephrotoxins ?Ensure adequate renal perfusion  ?IV hydration ? ?Hypokalemia ?P: ?Supplement as needed  ?Trend Bmet  ? ? ?Best Practice (right click and "Reselect all SmartList Selections" daily)  ? ?Diet/type: NPO ?DVT prophylaxis: other contraindicated ?GI prophylaxis: PPI ?Lines: Central line ?Foley:  Yes, and it is still needed ?Code Status:  full code ?Last date of multidisciplinary goals of care discussion: NO family at bedside currently will update on arrival  ? ?Critical care time:   ? ?Performed by: Kruze Atchley D. Harris ? ?Total critical care time: 39  minutes ? ?Critical care time was exclusive of separately billable procedures and treating other patients. ? ?Critical care was necessary to treat or prevent imminent or life-threatening deterioration. ? ?Critical care was time spent personally by me on the following activities: development of treatment plan with patient and/or surrogate as well as nursing, discussions with consultants, evaluation of patient's response to treatment, examination of patient, obtaining history from patient or surrogate, ordering and performing treatments and interventions, ordering and review of laboratory studies, ordering and review of radiographic studies, pulse oximetry and re-evaluation of patient's condition. ? ?Richardo Popoff D. Harris, NP-C ?[facility] ?Personal contact information can be found on Amion  ?05/09/2022, 8:18 AM ? ? ? ? ? ? ?

## 2022-05-09 NOTE — Consult Note (Signed)
Referring Provider: TRH ?Primary Care Physician:  Leonie Douglas, MD ?Primary Gastroenterologist:  Althia Forts ? ?Reason for Consultation: Upper GI bleed ? ?HPI: Katie Young is a 46 y.o. female with current tobacco/EtOH abuse, knee disease, COPD who presented to [facility] with altered mental status and reported hematemesis described as black blood x 2 days by the family who presented to [facility].  Patient was transferred to [facility] for GI bleed. ? ?On arrival to [facility] 5/15 patient was found to be profoundly hypotensive.  She received 3 L of IV fluids and started on vasopressors.  Patient's hemoglobin around 10.8 on arrival. ? ?Patient previously had an EGD done with [facility] in [place] notable for esophagitis no bleeding varices or ulcers.  ? ?She has a history of heavy drinking.  She was initially found by her daughter covered in coffee-ground emesis and lethargic. ? ?Today patient remains on sedation, pressors and full ventilation support.  She is on Unasyn to be taken for 5 days for aspiration pneumonitis.  She has chronic left lower leg osteomyelitis found with maggots in the wound.  Dietitian initiating tube feeding today.  GI consulted for history of hematemesis and upper GI bleed. ? ?Patient was sedated and on ventilation, unable to provide further history. ? ?Past Medical History:  ?Diagnosis Date  ? Chronic kidney disease   ? COPD (chronic obstructive pulmonary disease) ([place])   ? Dysphagia   ? Lower leg fracture   ? ? ?Past Surgical History:  ?Procedure Laterality Date  ? ABDOMINAL AORTOGRAM W/LOWER EXTREMITY N/A 12/24/2021  ? Procedure: ABDOMINAL AORTOGRAM W/LOWER EXTREMITY;  Surgeon: Cherre Robins, MD;  Location: [facility];  Service: Cardiovascular;  Laterality: N/A;  ? BIOPSY  12/31/2021  ? Procedure: BIOPSY;  Surgeon: Montez Morita, Quillian Quince, MD;  Location: [facility];  Service: Gastroenterology;;  ? COLONOSCOPY WITH PROPOFOL N/A 12/31/2021  ?  Procedure: COLONOSCOPY WITH PROPOFOL;  Surgeon: Harvel Quale, MD;  Location: [facility];  Service: Gastroenterology;  Laterality: N/A;  815  ? ESOPHAGEAL DILATION  12/31/2021  ? Procedure: ESOPHAGEAL DILATION;  Surgeon: Montez Morita, Quillian Quince, MD;  Location: [facility];  Service: Gastroenterology;;  ? ESOPHAGOGASTRODUODENOSCOPY (EGD) WITH PROPOFOL N/A 12/31/2021  ? Procedure: ESOPHAGOGASTRODUODENOSCOPY (EGD) WITH PROPOFOL;  Surgeon: Harvel Quale, MD;  Location: [facility];  Service: Gastroenterology;  Laterality: N/A;  ? FRACTURE SURGERY    ? TUBAL LIGATION    ? ? ?Prior to Admission medications   ?Medication Sig Start Date End Date Taking? Authorizing Provider  ?albuterol (PROVENTIL) (2.5 MG/3ML) 0.083% nebulizer solution Take 3 mLs by nebulization every 6 (six) hours as needed for wheezing or shortness of breath. 02/22/22  Yes [provider]  ?ipratropium-albuterol (DUONEB) 0.5-2.5 (3) MG/3ML SOLN Take 3 mLs by nebulization every 6 (six) hours as needed (Shortness of Breath or Wheezing). 11/23/21  Yes [provider]  ?OXYGEN Inhale 4 L into the lungs continuous.   Yes [provider]  ? ? ?Scheduled Meds: ? arformoterol  15 mcg Nebulization BID  ? chlorhexidine gluconate (MEDLINE KIT)  15 mL Mouth Rinse BID  ? Chlorhexidine Gluconate Cloth  6 each Topical Daily  ? folic acid  1 mg Intravenous Daily  ? insulin aspart  0-15 Units Subcutaneous Q4H  ? mouth rinse  15 mL Mouth Rinse 10 times per day  ? pantoprazole (PROTONIX) IV  40 mg Intravenous Q12H  ? revefenacin  175 mcg Nebulization Daily  ? thiamine injection  100 mg Intravenous Daily  ? ?Continuous Infusions: ? sodium chloride 50 mL/hr at 05/09/22 1200  ? ampicillin-sulbactam (UNASYN) IV Stopped (05/09/22 1016)  ? dexmedetomidine (PRECEDEX) IV infusion 0.6 mcg/kg/hr (05/09/22 1200)  ? fentaNYL infusion INTRAVENOUS 100 mcg/hr (05/09/22 1200)  ? norepinephrine (LEVOPHED) Adult infusion    ? octreotide   (SANDOSTATIN)    IV infusion 50 mcg/hr (05/09/22 1200)  ? ?PRN Meds:.albuterol, fentaNYL (SUBLIMAZE) injection, LORazepam, polyethylene glycol, prochlorperazine ? ?Allergies as of 05/08/2022 - Review Complete 05/08/2022  ?Allergen Reaction Noted  ? Sulfa antibiotics Nausea And Vomiting 04/15/2014  ? ? ?No family history on file. ? ?Social History  ? ?Socioeconomic History  ? Marital status: Divorced  ?  Spouse name: Not on file  ? Number of children: Not on file  ? Years of education: Not on file  ? Highest education level: Not on file  ?Occupational History  ? Not on file  ?Tobacco Use  ? Smoking status: Former  ?  Packs/day: 1.00  ?  Types: Cigarettes  ? Smokeless tobacco: Not on file  ?Vaping Use  ? Vaping Use: Never used  ?Substance and Sexual Activity  ? Alcohol use: Yes  ?  Comment: weekend use  ? Drug use: No  ? Sexual activity: Not on file  ?Other Topics Concern  ? Not on file  ?Social History Narrative  ? Not on file  ? ?Social Determinants of Health  ? ?Financial Resource Strain: Not on file  ?Food Insecurity: Not on file  ?Transportation Needs: Not on file  ?Physical Activity: Not on file  ?Stress: Not on file  ?Social Connections: Not on file  ?Intimate Partner Violence: Not on file  ? ? ?Review of Systems: Review of Systems  ?Unable to perform ROS: Intubated   ? ?Physical Exam:Physical Exam ?Constitutional:   ?   Appearance: She is normal weight.  ?HENT:  ?   Head: Normocephalic and atraumatic.  ?   Right Ear: External ear normal.  ?   Left Ear: External ear normal.  ?   Nose: Nose normal.  ?   Mouth/Throat:  ?   Mouth: Mucous membranes are moist.  ?   Comments: intubated ?Eyes:  ?   Pupils: Pupils are equal, round, and reactive to light.  ?Cardiovascular:  ?   Rate and Rhythm: Normal rate and regular rhythm.  ?   Pulses: Normal pulses.  ?   Heart sounds: Normal heart sounds.  ?Pulmonary:  ?   Comments: intubated ?Abdominal:  ?   General: Abdomen is flat. Bowel sounds are normal. There is no distension.   ?   Palpations: Abdomen is soft. There is no mass.  ?   Tenderness: There is no abdominal tenderness. There is no guarding or rebound.  ?   Hernia: No hernia is present.  ?Musculoskeletal:     ?   General: No swelling. Normal range of motion.  ?   Cervical back: Normal range of motion and neck supple.  ?Skin: ?   General: Skin is warm and dry.  ?   Coloration: Skin is pale.  ?Neurological:  ?   Comments: Patient sedated  ?  ?Vital signs: ?Vitals:  ? 05/09/22 1228 05/09/22 1229  ?BP:    ?Pulse:    ?Resp:    ?Temp:    ?SpO2: 100% 100%  ? ?Last BM Date :  (PTA) ? ? ? ?GI:  ?Lab Results: ?Recent Labs  ?  05/08/22 ?2235 05/09/22 ?0414  ?WBC 11.3* 12.4*  ?  HGB 9.1* 8.6*  ?HCT 27.0* 25.6*  ?PLT 173 168  ? ?BMET ?Recent Labs  ?  05/08/22 ?2235 05/09/22 ?0414 05/09/22 ?4196  ?NA 137 137 138  ?K 2.6* 2.8* 3.4*  ?CL 105 103 105  ?CO2 23 26 25   ?GLUCOSE 197* 179* 150*  ?BUN 56* 46* 40*  ?CREATININE 1.34* 1.12* 1.00  ?CALCIUM 7.3* 7.5* 7.6*  ? ?LFT ?Recent Labs  ?  05/08/22 ?2235  ?PROT 5.7*  ?ALBUMIN 2.3*  ?AST 35  ?ALT 34  ?ALKPHOS 87  ?BILITOT 0.9  ? ?PT/INR ?Recent Labs  ?  05/08/22 ?2235  ?LABPROT 14.7  ?INR 1.2  ? ? ? ?Studies/Results: ?DG Abd 1 View ? ?Result Date: 05/08/2022 ?CLINICAL DATA:  Check gastric catheter placement EXAM: ABDOMEN - 1 VIEW COMPARISON:  Chest x-ray from earlier in the same day. FINDINGS: Gastric catheter is noted within the stomach. Scattered large and small bowel gas is noted. No free air is seen. IMPRESSION: Gastric catheter within the stomach. Electronically Signed   By: Inez Catalina M.D.   On: 05/08/2022 20:37  ? ?DG CHEST PORT 1 VIEW ? ?Result Date: 05/08/2022 ?CLINICAL DATA:  Tube placement EXAM: PORTABLE CHEST 1 VIEW COMPARISON:  Chest x-ray earlier the same day FINDINGS: Endotracheal tube tip is approximately 5 cm above the carina. Enteric tube tip is in the stomach. Stable right internal jugular line with the tip in the SVC. No focal consolidation identified in the lungs. No pleural effusion  or pneumothorax appreciated. IMPRESSION: Interval intubation.  Medical devices as described. Electronically Signed   By: Ofilia Neas M.D.   On: 05/08/2022 20:36   ? ?Impression: ?Coffee ground emesis

## 2022-05-09 NOTE — IPAL (Signed)
?  Interdisciplinary Goals of Care Family Meeting ? ? ?Date carried out: 05/09/2022 ? ?Location of the meeting: Phone conference ? ?Member's involved: Nurse Practitioner and Family Member or next of kin ? ?Durable Power of Insurance risk surveyor: Patient Daughter Marcelino Duster    ? ?Discussion: Called and spoke to [place] regarding current clinical status and plan of care for [facility]. Marcelino Duster states that [place] was found in very poor conditions ands she is concerned that she is no longer able to care for herself. A TOC consult was placed.  ? ?We also discussed GOC and Marcelino Duster request full code with full scope of care. Marcelino Duster states that [place] had a DNR in the past but she would not want this implemented at this time as that was a one time situation.  ? ?Code status: Full Code ? ?Disposition: Continue current acute care ? ? ?Time spent for the meeting: 25 mins ? ?Ariyanah Aguado D. Harris ?05/09/2022, 12:58 PM  ?

## 2022-05-09 NOTE — Progress Notes (Signed)
Patient's temperature 91.7 rectal. Bair hugger placed.  ?

## 2022-05-09 NOTE — Consult Note (Addendum)
WOC Nurse Consult Note: ?Patient receiving care in [facility] ICU 1239. Primary RNs participating at bedside with wound assessment and patient positioning. ?Reason for Consult: LLE wound ?Wound type: The wound is a full thickness wound along the lower aspect of a former incision on the medial aspect of the LLE. The remainder of the incision line is well healed.  There is a narrow tunnel and the interior and superior border of the wound from which maggots were flushed.  I can see more maggots in these tunnels, but cannot get to them to get them out. ?Pressure Injury POA: NA ?Measurement: 2.2 cm x 1.2 cm x 1 cm ?Wound bed: beefy red ?Drainage (amount, consistency, odor) bloody secretions with intact maggots ?Periwound: intact ?Dressing procedure/placement/frequency: ?Hold the LLE over the trash can. Pour either sterile water or normal saline over the wound to flush out any loose maggots. Rinse the wound well.  Fill the wound bed with dry gauze, cover with dry gauze, use a small amount of kerlix to hold the dressing in place.  I performed this care while assessing the patient today. ? ?The nurses also requested assessment of the buttocks. There is peeling skin on the buttocks, and her buttocks is slightly darker brown that the remainder of her skin, but the area is more consistent with MASD, no referred to as, Irritant Dermatitis ?[id number] - Due to fecal, urinary or dual incontinence.  There was a sacral foam dressing in use. ? ?Also, in general, her skin is in poor condition. There are many scratches, abrasions, and what are highly likely to be insect bites across much of her body.  On the right lateral malleolus, these conditions are present, along with a couple of darkly scabbed/dry areas.  For now, routing skin hygiene measures and monitoring for further deterioration is what is needed. ? ?I have added an order to float the heels. ? ?Monitor the wound area(s) for worsening of condition such as: ?Signs/symptoms of infection,   ?Increase in size,  ?Development of or worsening of odor, ?Development of pain, or increased pain at the affected locations.  Notify the medical team if any of these develop. ? ?Thank you for the consult.  Discussed plan of care with the bedside nurses.  The patient is intubated and sedated.  WOC nurse will not follow at this time.  Please re-consult the WOC team if needed. ? ?Helmut Muster, RN, MSN, CWOCN, CNS-BC, pager [phone]  ?

## 2022-05-09 NOTE — TOC Initial Note (Signed)
Transition of Care (TOC) - Initial/Assessment Note  ? ? ?Patient Details  ?Name: Katie Young ?MRN: [id number] ?Date of Birth: [birth year] ? ?Transition of Care (TOC) CM/SW Contact:    ?Indiya Izquierdo, LCSW ?Phone Number: ?05/09/2022, 1:57 PM ? ?Clinical Narrative:                 ?Have spoken with pt's daughter, Marcelino Duster, to review pt's current living situation and anticipated dc needs.  Pt currently intubated. ?Daughter expresses much concern over the living conditions she found patient in and notes she feels the apt was not inhabitable any longer. Does note that this was not pt's apt, but the apt of a female friend of pt's.  Daughter does not feel it is a safe place for pt to return at dc and believes that pt will require SNF WHEN she is medically ready for dc.  She is concerned that pt may not be agreeable to SNF but family plans to push this with her when/ if needed.  Daughter confirms that pt has been in SNF at [facility] several months ago and had discharged home in January.  States that pt began drinking heavily again at that time and not taking care of herself at all.  Re: ETOH use/ abuse, daughter reports that pt's mother "has sent her so many places over the years... we've all tried to keep her stable."   ?At this point, TOC will continue to follow with anticipation that SNF may be most appropriate dc plan.   ? ?Expected Discharge Plan: Skilled Nursing Facility ?Barriers to Discharge: Continued Medical Work up ? ? ?Patient Goals and CMS Choice ?Patient states their goals for this hospitalization and ongoing recovery are:: pt intubated ?  ?  ? ?Expected Discharge Plan and Services ?Expected Discharge Plan: Skilled Nursing Facility ?In-house Referral: Clinical Social Work ?  ?  ?Living arrangements for the past 2 months: Apartment ?                ?  ?  ?  ?  ?  ?  ?  ?  ?  ?  ? ?Prior Living Arrangements/Services ?Living arrangements for the past 2 months: Apartment ?Lives with:: Friends ?Patient language and  need for interpreter reviewed:: Yes ?       ?Need for Family Participation in Patient Care: Yes (Comment) ?Care giver support system in place?: No (comment) ?  ?Criminal Activity/Legal Involvement Pertinent to Current Situation/Hospitalization: No - Comment as needed ? ?Activities of Daily Living ?Home Assistive Devices/Equipment: Cane (specify quad or straight), Eyeglasses, Dentures (specify type), Oxygen, Nebulizer, Walker (specify type) ?ADL Screening (condition at time of admission) ?Patient's cognitive ability adequate to safely complete daily activities?: Yes ?Is the patient deaf or have difficulty hearing?: No ?Does the patient have difficulty seeing, even when wearing glasses/contacts?: Yes ?Does the patient have difficulty concentrating, remembering, or making decisions?: Yes ?Patient able to express need for assistance with ADLs?: No ?Does the patient have difficulty dressing or bathing?: Yes ?Independently performs ADLs?: No ?Communication: Independent ?Dressing (OT): Dependent ?Is this a change from baseline?: Change from baseline, expected to last <3days ?Grooming: Dependent ?Is this a change from baseline?: Change from baseline, expected to last <3 days ?Feeding: Dependent ?Is this a change from baseline?: Change from baseline, expected to last <3 days ?Bathing: Dependent ?Is this a change from baseline?: Change from baseline, expected to last <3 days ?Toileting: Dependent ?Is this a change from baseline?: Change from baseline, expected to last <3  days ?In/Out Bed: Dependent ?Is this a change from baseline?: Change from baseline, expected to last <3 days ?Walks in Home: Needs assistance ?Is this a change from baseline?: Change from baseline, expected to last <3 days ?Does the patient have difficulty walking or climbing stairs?: Yes ?Weakness of Legs: Both ?Weakness of Arms/Hands: Both ? ?Permission Sought/Granted ?Permission sought to share information with : Family Supports ?Permission granted to  share information with : Yes, Verbal Permission Granted ? Share Information with NAME: Jaynia Fendley ?   ? Permission granted to share info w Relationship: daughter ? Permission granted to share info w Contact Information: [phone] ? ?Emotional Assessment ?  ?Attitude/Demeanor/Rapport: Intubated (Following Commands or Not Following Commands) ?  ?  ?Alcohol / Substance Use: Alcohol Use ?Psych Involvement: No (comment) ? ?Admission diagnosis:  Acute upper GI bleed [K92.2] ?Patient Active Problem List  ? Diagnosis Date Noted  ? Pressure injury of skin 05/09/2022  ? Malnutrition of moderate degree 05/09/2022  ? Acute upper GI bleed 05/08/2022  ? Regurgitation of food 12/28/2021  ? Constipation 12/28/2021  ? Internal orthopedic device with infection or inflammatory reaction (HCC) 04/21/2014  ? Painful orthopaedic hardware (HCC) 04/21/2014  ? ALCOHOL ABUSE 06/03/2009  ? Pain in limb 06/03/2009  ? OBESITY 06/01/2009  ? DIABETES MELLITUS, TYPE II 03/14/2008  ? HYPERLIPIDEMIA 03/14/2008  ? ?PCP:  Waldon Reining, MD ?Pharmacy:   ?[facility] [place],  - [address] ?[address] ?[place] [address] ?Phone: [phone] Fax: [phone] ? ? ? ? ?Social Determinants of Health (SDOH) Interventions ?  ? ?Readmission Risk Interventions ?   ? View : No data to display.  ?  ?  ?  ? ? ? ?

## 2022-05-10 ENCOUNTER — Inpatient Hospital Stay (HOSPITAL_COMMUNITY): Payer: Medicaid Other

## 2022-05-10 DIAGNOSIS — J988 Other specified respiratory disorders: Secondary | ICD-10-CM

## 2022-05-10 DIAGNOSIS — K922 Gastrointestinal hemorrhage, unspecified: Secondary | ICD-10-CM | POA: Diagnosis not present

## 2022-05-10 LAB — CBC
HCT: 25.6 % — ABNORMAL LOW (ref 36.0–46.0)
Hemoglobin: 8.3 g/dL — ABNORMAL LOW (ref 12.0–15.0)
MCH: 33.5 pg (ref 26.0–34.0)
MCHC: 32.4 g/dL (ref 30.0–36.0)
MCV: 103.2 fL — ABNORMAL HIGH (ref 80.0–100.0)
Platelets: 134 10*3/uL — ABNORMAL LOW (ref 150–400)
RBC: 2.48 MIL/uL — ABNORMAL LOW (ref 3.87–5.11)
RDW: 15.7 % — ABNORMAL HIGH (ref 11.5–15.5)
WBC: 20.3 10*3/uL — ABNORMAL HIGH (ref 4.0–10.5)
nRBC: 0.6 % — ABNORMAL HIGH (ref 0.0–0.2)

## 2022-05-10 LAB — BASIC METABOLIC PANEL
Anion gap: 6 (ref 5–15)
BUN: 22 mg/dL — ABNORMAL HIGH (ref 6–20)
CO2: 25 mmol/L (ref 22–32)
Calcium: 7.9 mg/dL — ABNORMAL LOW (ref 8.9–10.3)
Chloride: 111 mmol/L (ref 98–111)
Creatinine, Ser: 0.78 mg/dL (ref 0.44–1.00)
GFR, Estimated: >60 mL/min (ref >60)
Glucose, Bld: 138 mg/dL — ABNORMAL HIGH (ref 70–99)
Potassium: 3.8 mmol/L (ref 3.5–5.1)
Sodium: 142 mmol/L (ref 135–145)

## 2022-05-10 LAB — MAGNESIUM: Magnesium: 2.2 mg/dL (ref 1.7–2.4)

## 2022-05-10 LAB — LEGIONELLA PNEUMOPHILA SEROGP 1 UR AG: L. pneumophila Serogp 1 Ur Ag: NEGATIVE

## 2022-05-10 LAB — GLUCOSE, CAPILLARY
Glucose-Capillary: 111 mg/dL — ABNORMAL HIGH (ref 70–99)
Glucose-Capillary: 127 mg/dL — ABNORMAL HIGH (ref 70–99)
Glucose-Capillary: 157 mg/dL — ABNORMAL HIGH (ref 70–99)
Glucose-Capillary: 162 mg/dL — ABNORMAL HIGH (ref 70–99)
Glucose-Capillary: 82 mg/dL (ref 70–99)
Glucose-Capillary: 163 mg/dL — ABNORMAL HIGH (ref 70–99)

## 2022-05-10 LAB — IRON AND TIBC
Iron: 18 ug/dL — ABNORMAL LOW (ref 28–170)
Saturation Ratios: 9 % — ABNORMAL LOW (ref 10.4–31.8)
TIBC: 210 ug/dL — ABNORMAL LOW (ref 250–450)
UIBC: 192 ug/dL

## 2022-05-10 LAB — RETICULOCYTES
Immature Retic Fract: 17.4 % — ABNORMAL HIGH (ref 2.3–15.9)
RBC.: 2.48 MIL/uL — ABNORMAL LOW (ref 3.87–5.11)
Retic Count, Absolute: 75.9 10*3/uL (ref 19.0–186.0)
Retic Ct Pct: 3.1 % (ref 0.4–3.1)

## 2022-05-10 LAB — VITAMIN B12: Vitamin B-12: 623 pg/mL (ref 180–914)

## 2022-05-10 LAB — PHOSPHORUS
Phosphorus: 1.5 mg/dL — ABNORMAL LOW (ref 2.5–4.6)
Phosphorus: 4.1 mg/dL (ref 2.5–4.6)

## 2022-05-10 LAB — VITAMIN D 25 HYDROXY (VIT D DEFICIENCY, FRACTURES): Vit D, 25-Hydroxy: 10.28 ng/mL — ABNORMAL LOW (ref 30–100)

## 2022-05-10 LAB — FOLATE: Folate: 16.8 ng/mL (ref >5.9)

## 2022-05-10 LAB — FERRITIN: Ferritin: 112 ng/mL (ref 11–307)

## 2022-05-10 MED ORDER — THIAMINE HCL 100 MG PO TABS
100.0000 mg | ORAL_TABLET | Freq: Every day | ORAL | Status: DC
  Administered 2022-05-10: 100 mg
  Filled 2022-05-10: qty 1

## 2022-05-10 MED ORDER — VITAL AF 1.2 CAL PO LIQD
1000.0000 mL | ORAL | Status: DC
  Administered 2022-05-10 – 2022-05-11 (×2): 1000 mL

## 2022-05-10 MED ORDER — ASCORBIC ACID 500 MG PO TABS
500.0000 mg | ORAL_TABLET | Freq: Two times a day (BID) | ORAL | Status: DC
  Administered 2022-05-10 (×2): 500 mg
  Filled 2022-05-10 (×2): qty 1

## 2022-05-10 MED ORDER — SODIUM CHLORIDE 0.9 % IV SOLN
125.0000 mg | Freq: Once | INTRAVENOUS | Status: AC
  Administered 2022-05-10: 125 mg via INTRAVENOUS
  Filled 2022-05-10: qty 10

## 2022-05-10 MED ORDER — PROSOURCE TF PO LIQD
45.0000 mL | Freq: Two times a day (BID) | ORAL | Status: DC
  Administered 2022-05-10: 45 mL
  Filled 2022-05-10: qty 45

## 2022-05-10 MED ORDER — POTASSIUM PHOSPHATES 15 MMOLE/5ML IV SOLN
45.0000 mmol | Freq: Once | INTRAVENOUS | Status: AC
  Administered 2022-05-10: 45 mmol via INTRAVENOUS
  Filled 2022-05-10: qty 15

## 2022-05-10 MED ORDER — FOLIC ACID 1 MG PO TABS
1.0000 mg | ORAL_TABLET | Freq: Every day | ORAL | Status: DC
  Administered 2022-05-10: 1 mg
  Filled 2022-05-10: qty 1

## 2022-05-10 MED ORDER — JUVEN PO PACK
1.0000 | PACK | Freq: Two times a day (BID) | ORAL | Status: DC
Start: 2022-05-10 — End: 2022-05-16
  Administered 2022-05-10 – 2022-05-16 (×9): 1
  Filled 2022-05-10 (×9): qty 1

## 2022-05-10 MED ORDER — ADULT MULTIVITAMIN W/MINERALS CH
1.0000 | ORAL_TABLET | Freq: Every day | ORAL | Status: DC
  Administered 2022-05-10: 1
  Filled 2022-05-10: qty 1

## 2022-05-10 MED ORDER — VITAL HIGH PROTEIN PO LIQD
1000.0000 mL | ORAL | Status: DC
  Administered 2022-05-10: 1000 mL

## 2022-05-10 NOTE — Progress Notes (Signed)
Subjective: ?Remains intubated. ? ?Objective: ?Vital signs in last 24 hours: ?Temp:  [97 ?F (36.1 ?C)-98.4 ?F (36.9 ?C)] 97.2 ?F (36.2 ?C) (05/16 0800) ?Pulse Rate:  [45-76] 59 (05/16 1045) ?Resp:  [14-21] 19 (05/16 1045) ?BP: (74-159)/(42-81) 105/62 (05/16 1045) ?SpO2:  [97 %-100 %] 100 % (05/16 1045) ?FiO2 (%):  [30 %-40 %] 30 % (05/16 0800) ?Weight:  [66.3 kg] 66.3 kg (05/16 0247) ?Weight change: 1.5 kg ?Last BM Date :  (PTA) ? ?PE: ?GEN:  Intubated, sedated ?HEENT:  OGT scant coffee grounds ?ABD:  Soft, non-tender, non-distended ? ?Lab Results: ?CBC ?   ?Component Value Date/Time  ? WBC 20.3 (H) 05/10/2022 0420  ? RBC 2.48 (L) 05/10/2022 0421  ? RBC 2.48 (L) 05/10/2022 0420  ? HGB 8.3 (L) 05/10/2022 0420  ? HCT 25.6 (L) 05/10/2022 0420  ? PLT 134 (L) 05/10/2022 0420  ? MCV 103.2 (H) 05/10/2022 0420  ? MCH 33.5 05/10/2022 0420  ? MCHC 32.4 05/10/2022 0420  ? RDW 15.7 (H) 05/10/2022 0420  ? LYMPHSABS 1.7 05/08/2022 2235  ? MONOABS 0.5 05/08/2022 2235  ? EOSABS 0.0 05/08/2022 2235  ? BASOSABS 0.0 05/08/2022 2235  ?CMP  ?   ?Component Value Date/Time  ? NA 142 05/10/2022 0420  ? K 3.8 05/10/2022 0420  ? CL 111 05/10/2022 0420  ? CO2 25 05/10/2022 0420  ? GLUCOSE 138 (H) 05/10/2022 0420  ? BUN 22 (H) 05/10/2022 0420  ? CREATININE 0.78 05/10/2022 0420  ? CALCIUM 7.9 (L) 05/10/2022 0420  ? PROT 5.7 (L) 05/08/2022 2235  ? ALBUMIN 2.3 (L) 05/08/2022 2235  ? AST 35 05/08/2022 2235  ? ALT 34 05/08/2022 2235  ? ALKPHOS 87 05/08/2022 2235  ? BILITOT 0.9 05/08/2022 2235  ? GFRNONAA >60 05/10/2022 0420  ? GFRAA >90 04/15/2014 1605  ? ? ?Assessment: ? ? Altered mental status, on ventilator. ? Coffee ground emesis by remort. ? Acute hypoxic respiratory failure, history COPD on home oxygen. ? ?Plan: ? ? PPI. ?OK to discontinue octreotide. ?Tube feeds ok. ?No plans for EGD in absence of destabilizing frank bright red hematemesis. ?[facility] will follow along at a distance; please call us back as needed. ? ? Freddy Jaksch ?05/10/2022, 11:01 AM ? ? ?Cell [phone] ?If no answer or after 5 PM call [phone] ? ?

## 2022-05-10 NOTE — Progress Notes (Signed)
MEDICATION RELATED CONSULT NOTE ?Pharmacy - IV Iron Dosing ? ?Assessment: ?Iron/TIBC/Ferritin/ %Sat ?   ?Component Value Date/Time  ? IRON 18 (L) 05/10/2022 0420  ? TIBC 210 (L) 05/10/2022 0420  ? FERRITIN 112 05/10/2022 0420  ? IRONPCTSAT 9 (L) 05/10/2022 0420  ? ?Plan: ?Ferric gluconate 125 mg IV x 1 ?Pharmacy to follow-up if additional doses needed after 2-3 days ?Consider PO iron supplementation if tolerated ? ? ? ?Lynann Beaver PharmD, BCPS ?Clinical Pharmacist ?[facility] [phone] ?05/10/2022 10:12 AM ? ?

## 2022-05-10 NOTE — Progress Notes (Signed)
[facility] ADULT ICU REPLACEMENT PROTOCOL ? ? ?The patient does apply for the [facility] Adult ICU Electrolyte Replacment Protocol based on the criteria listed below:  ? ?1.Exclusion criteria: TCTS patients, ECMO patients, and Dialysis patients ?2. Is GFR >/= 30 ml/min? Yes.    ?Patient's GFR today is >60 ?3. Is SCr </= 2? Yes.   ?Patient's SCr is 0.78 mg/dL ?4. Did SCr increase >/= 0.5 in 24 hours? No. ?5.Pt's weight >40kg  Yes.   ?6. Abnormal electrolyte(s): Phos 1.5 with K+ 3.8  ?7. Electrolytes replaced per protocol ?8.  Call MD STAT for K+ </= 2.5, Phos </= 1, or Mag </= 1 ?Physician:  Dr.Yap ? ?Katie Young 05/10/2022 7:03 AM  ?

## 2022-05-10 NOTE — Progress Notes (Signed)
eLink Physician-Brief Progress Note ?Patient Name: Katie Young ?DOB: [birth year] ?MRN: [id number] ? ? ?Date of Service ? 05/10/2022  ?HPI/Events of Note ? Received query if OGT had to be pulled back.  KUB showing OGT in gastric antrum, slightly further than what has been recorded before.    ?eICU Interventions ? Keep OGT at current position.  Ok to use OGT.   ? ? ? ?Intervention Category ?Intermediate Interventions: Diagnostic test evaluation ? ?Larinda Buttery ?05/10/2022, 1:50 AM ?

## 2022-05-10 NOTE — Progress Notes (Signed)
? ?NAME:  Katie Young, MRN:  [id number], DOB:  Jun 06, 1976, LOS: 2 ?ADMISSION DATE:  05/08/2022, CONSULTATION DATE:  05/08/22 ?REFERRING MD:  Romie Minus, MD, CHIEF COMPLAINT:  GI Bleed  ? ?History of Present Illness:  ?46 year old female with current tobacco/EtOH abuse who presents with altered mental status and reported hematemesis described as black blood x 2 days by the family who presented to [facility]. No further episodes of hematemesis on arrival however was profoundly hypotensive to the 50s. She received 3L IVF and started of vasopressors. R IJ central line placed. Last Hg 10.8, similar to prior labs she had there. K 3.1. BUN/Cr 78/2.49. AST 47 ALT 48 AP 127. PT/INR 11/1.08.  ? ?Of note she has had an EGD with [facility] in [place] in 12/2021 that demonstrated esophagitis but no varices or ulcers.   ? ?Per her daughter Marcelino Duster, she lives with her boyfriend.  Marcelino Duster had not seen her in several weeks.  Was recently in rehab and discharged to home about 2 months ago.  Unfortunately she started drinking heavily again after her discharge.  Today when Marcelino Duster went to check on her she found her covered in black coffee ground emesis and lethargic.  She was awake enough  to refuse transfer to the ED from paramedics at that time.   ? ?Pertinent  Medical History  ?EtOH use, CKD, anxiety, hx stroke, COPD on oxygen, DM2, HLD, HTN, GERD ? ?Significant Hospital Events: ?Including procedures, antibiotic start and stop dates in addition to other pertinent events   ?5/14 Admitted to [facility] from [facility] for GI bleed ?Head CT at OSH: No acute intracranial pathology. ?Possible sinusitis  ?5/15  Sedated on vent with no acute complications since admit overnight  ?5/16 Some issues with bradycardia overnight likely secondary to sedation ? ?Interim History / Subjective:  ?Sedated on vent  ? ?Objective   ?Blood pressure 111/61, pulse (!) 47, temperature 97.8 ?F (36.6 ?C), temperature source Axillary, resp. rate 18, height  5\' 9"  (1.753 m), weight 66.3 kg, last menstrual period 03/09/2014, SpO2 100 %. ?CVP:  [10 mmHg-15 mmHg] 15 mmHg  ?Vent Mode: PRVC ?FiO2 (%):  [40 %-50 %] 40 % ?Set Rate:  [18 bmp] 18 bmp ?Vt Set:  [530 mL] 530 mL ?PEEP:  [5 cmH20] 5 cmH20 ?Plateau Pressure:  [16 cmH20-18 cmH20] 18 cmH20  ? ?Intake/Output Summary (Last 24 hours) at 05/10/2022 0701 ?Last data filed at 05/10/2022 0650 ?Gross per 24 hour  ?Intake 3921.48 ml  ?Output 1595 ml  ?Net 2326.48 ml  ? ? ?Filed Weights  ? 05/08/22 1900 05/09/22 0434 05/10/22 0247  ?Weight: 64.8 kg 64.8 kg 66.3 kg  ? ? ?Examination: ?General: Acute on chronically ill appearing adult female lying in bed on mechanical ventilation, in NAD ?HEENT: ETT, MM pink/moist, PERRL,  ?Neuro: Sedated on vent  ?CV: s1s2 regular rate and rhythm, no murmur, rubs, or gallops,  ?PULM:  Clear to ascultation, tolerating vent, initiated little to no effort on SBT this am  ?GI: soft, bowel sounds active in all 4 quadrants, non-tender, non-distended ?Extremities: warm/dry, no edema,LLE wound wrapped in dry dressing  ?Skin: no rashes or lesions ? ?Resolved Hospital Problem list   ?N/A ? ?Assessment & Plan:  ?Acute metabolic encephalopathy ?-Intoxication vs sepsis vs hepatic encephalopathy?  ?-Head CT at OSH negative  ?Hx seizures since childhood ?P: ?Maintain neuro protective measures ?Nutrition and bowel regiment  ?Seizure precautions  ?Aspirations precautions  ?Minimize sedation  ?Delirium precautions  ? ?Hematemesis  secondary to suspected upper GI bleed ?-EGD with [facility] in [place] in 12/2021 that demonstrated esophagitis but no varices or ulcers.   ?Hx EtOH abuse ?P: ?GI following, appreciate assistance ?Continue to trend CBC  ?Hgb goal >7 ?Continue PPI BID and Octreotide drip  ?CIWA scale  ?Supplement folate, thiamine, and MVI  ? ?Acute hypoxemic respiratory failure ?-Concern for possible overdose/intoxication vs aspiration event in the setting of GI bleed  ?COPD on home O2 ?Tobacco abuse ?Concern  for aspiration PNA ?P: ?Failed SBT this am due to little to no effort initiated on wean ?Continue ventilator support with lung protective strategies  ?Wean PEEP and FiO2 for sats greater than 90%. ?Head of bed elevated 30 degrees. ?Plateau pressures less than 30 cm H20.  ?Follow intermittent chest x-ray and ABG.   ?Ensure adequate pulmonary hygiene  ?Follow cultures  ?VAP bundle in place  ?PAD protocol ?Continue empiric Unasyn  ? ?Hypotension  ?-Hypovolemia vs sepsis vs acute bleeding. 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 ?Chronic left lower extremity osteomyelitis ?-Per chart review LLE wound dated back to 2013 after undergoing ORIF to ankle fracture   ?-Per chart review it appears that patient last saw wound care 10/05/2021 at which time Augmentin was prescribed and dressing orders placed   ?-On admission wound appeared to be infested with maggots  ?P: ?WOC consult  ?Local wound care  ?Antibiotics as above  ? ?Acute on chronic kidney failure  ?-Creatinine on admit 1.34 with GFR 49, Creatinine 66 with GFR >90 03/2014 ?P: ?Follow renal function  ?Monitor urine output ?Trend Bmet ?Avoid nephrotoxins ?Ensure adequate renal perfusion  ? ?Hypokalemia ?Hypophosphoremia  ?P: ?Supplement as needed  ?Trend Bmet  ? ?Best Practice (right click and "Reselect all SmartList Selections" daily)  ? ?Diet/type: NPO ?DVT prophylaxis: other contraindicated ?GI prophylaxis: PPI ?Lines: Central line ?Foley:  Yes, and it is still needed ?Code Status:  full code ?Last date of multidisciplinary goals of care discussion: NO family at bedside currently will update on arrival  ? ?Critical care time:   ? ?Performed by: Adrion Menz D. Harris ? ?Total critical care time: 38 minutes ? ?Critical care time was exclusive of separately billable procedures and treating other patients. ? ?Critical care was necessary to treat or prevent  imminent or life-threatening deterioration. ? ?Critical care was time spent personally by me on the following activities: development of treatment plan with patient and/or surrogate as well as nursing, discussions with consultants, evaluation of patient's response to treatment, examination of patient, obtaining history from patient or surrogate, ordering and performing treatments and interventions, ordering and review of laboratory studies, ordering and review of radiographic studies, pulse oximetry and re-evaluation of patient's condition. ? ?Jumaane Weatherford D. Harris, NP-C ?[facility] ?Personal contact information can be found on Amion  ?05/10/2022, 7:01 AM ? ? ? ? ? ? ?

## 2022-05-10 NOTE — Progress Notes (Signed)
During shift, OG tube noted to be measuring 48cm external length. Ausculated in correct position, continues to have output with LIWS and no change in respiratory status. Spoke with Md on call and orders given to leave OG at current place with 48cm external measurement.  ? ?Also noted that Central Monitoring questioned a new change in rhythm on the monitor questionable for A fib. 12 lead EKG done and discussed with Elink RN. AM labs sent at this time. Otherwise VSS apart from continued SB in 40-50s ? ?Will continue to monitor.  ?

## 2022-05-10 NOTE — Progress Notes (Addendum)
Nutrition Follow-up ? ?DOCUMENTATION CODES:  ? ?Non-severe (moderate) malnutrition in context of social or environmental circumstances ? ?INTERVENTION:  ?Monitor magnesium, potassium, and phosphorus BID for at least 3 days, MD to replete as needed, as pt is at risk for refeeding syndrome. ?  ?-Initiate Vital AF 1.2 @ 20 ml/hr, advance by 10 ml every 12 hours to goal rate of 60 ml/hr via OGT. ?-This will provide 1728 kcals, 108g protein and 1167 ml H2O ?  ?-1 packet Juven BID via tube, each packet provides 95 calories, 2.5 grams of protein (collagen), and 9.8 grams of carbohydrate (3 grams sugar); also contains 7 grams of L-arginine and L-glutamine, 300 mg vitamin C, 15 mg vitamin E, 1.2 mcg vitamin B-12, 9.5 mg zinc, 200 mg calcium, and 1.5 g  Calcium Beta-hydroxy-Beta-methylbutyrate to support wound healing  ?  ?-Recommend supplementation of Vitamin D ?  ?-500 mg Vitamin C BID via tube ?  ?-Multivitamin with minerals daily via tube ? ?-Recommend Zinc sulfate 220 mg for wound healing ? ? ?NUTRITION DIAGNOSIS:  ? ?Moderate Malnutrition related to social / environmental circumstances (self-neglect, EtOH abuse) as evidenced by mild fat depletion, moderate muscle depletion. ? ?Ongoing. ? ?GOAL:  ? ?Patient will meet greater than or equal to 90% of their needs ? ?Progressing now that tube feeding is starting. ? ?MONITOR:  ? ?Vent status, Labs, Weight trends, Skin, I & O's ? ?REASON FOR ASSESSMENT:  ? ?Consult ?Assessment of nutrition requirement/status, Enteral/tube feeding initiation and management ? ?ASSESSMENT:  ? ?46 year old female with current tobacco/EtOH abuse who presents with altered mental status and reported hematemesis described as black blood x 2 days by the family who presented to [facility]. Admitted for upper GI Bleed. ? ?Consult received for assessment. Messaged NP to verify RD could make changes to tube feeding orders. This was confirmed by NP, added initiation and management consult.  ?Protocol  was started, will switch to Vital AF 1.2 to start at 20 ml/hr and advance slowly per recommendations above.  ? ?Will add supplementation to aid  in wound healing via tube. ? ?Tip of tube located in the gastric antrum. Per CCM, no need to pull back tube. ? ?Patient is currently intubated on ventilator support ?MV: 8.9 L/min ?Temp (24hrs), Avg:97.5 ?F (36.4 ?C), Min:97 ?F (36.1 ?C), Max:98.4 ?F (36.9 ?C) ? ?Admission weight: 142 lbs. ?Current weight: 146 lbs ? ?Medications: Folic acid, Thiamine, Fentanyl, Levophed, KCl, K-Phos ? ?Labs reviewed: ? CBGs: 82-127 ?Low Phos (1.5) ?Low iron (18) ?Low Vit D (10.28) ?B-12 WNL ?Folate WNL ? ?Diet Order:   ?Diet Order   ? ?       ?  Diet NPO time specified  Diet effective now       ?  ? ?  ?  ? ?  ? ? ?EDUCATION NEEDS:  ? ?Not appropriate for education at this time ? ?Skin:  Skin Assessment: Skin Integrity Issues: ?Skin Integrity Issues:: Unstageable, Other (Comment) ?Unstageable: right, left, buttocks ?Other: Non-pressure wound to LLE ? ?Last BM:  PTA ? ?Height:  ? ?Ht Readings from Last 1 Encounters:  ?05/08/22 5\' 9"  (1.753 m)  ? ? ?Weight:  ? ?Wt Readings from Last 1 Encounters:  ?05/10/22 66.3 kg  ? ? ?BMI:  Body mass index is 21.58 kg/m?. ? ?Estimated Nutritional Needs:  ? ?Kcal:  1522 ? ?Protein:  95-105g ? ?Fluid:  1.7L/day ? ? ?Clayton Bibles, MS, RD, LDN ?Inpatient Clinical Dietitian ?Contact information available via Amion ? ?

## 2022-05-11 DIAGNOSIS — J988 Other specified respiratory disorders: Secondary | ICD-10-CM | POA: Diagnosis not present

## 2022-05-11 DIAGNOSIS — K922 Gastrointestinal hemorrhage, unspecified: Secondary | ICD-10-CM | POA: Diagnosis not present

## 2022-05-11 LAB — GLUCOSE, CAPILLARY
Glucose-Capillary: 83 mg/dL (ref 70–99)
Glucose-Capillary: 151 mg/dL — ABNORMAL HIGH (ref 70–99)
Glucose-Capillary: 145 mg/dL — ABNORMAL HIGH (ref 70–99)
Glucose-Capillary: 123 mg/dL — ABNORMAL HIGH (ref 70–99)
Glucose-Capillary: 169 mg/dL — ABNORMAL HIGH (ref 70–99)
Glucose-Capillary: 38 mg/dL — CL (ref 70–99)
Glucose-Capillary: 42 mg/dL — CL (ref 70–99)
Glucose-Capillary: 151 mg/dL — ABNORMAL HIGH (ref 70–99)
Glucose-Capillary: 104 mg/dL — ABNORMAL HIGH (ref 70–99)

## 2022-05-11 LAB — BASIC METABOLIC PANEL
Anion gap: 6 (ref 5–15)
BUN: 16 mg/dL (ref 6–20)
CO2: 27 mmol/L (ref 22–32)
Calcium: 7.9 mg/dL — ABNORMAL LOW (ref 8.9–10.3)
Chloride: 116 mmol/L — ABNORMAL HIGH (ref 98–111)
Creatinine, Ser: 0.65 mg/dL (ref 0.44–1.00)
GFR, Estimated: >60 mL/min (ref >60)
Glucose, Bld: 124 mg/dL — ABNORMAL HIGH (ref 70–99)
Potassium: 3.5 mmol/L (ref 3.5–5.1)
Sodium: 149 mmol/L — ABNORMAL HIGH (ref 135–145)

## 2022-05-11 LAB — CBC
HCT: 23.3 % — ABNORMAL LOW (ref 36.0–46.0)
Hemoglobin: 7.5 g/dL — ABNORMAL LOW (ref 12.0–15.0)
MCH: 33.6 pg (ref 26.0–34.0)
MCHC: 32.2 g/dL (ref 30.0–36.0)
MCV: 104.5 fL — ABNORMAL HIGH (ref 80.0–100.0)
Platelets: 109 10*3/uL — ABNORMAL LOW (ref 150–400)
RBC: 2.23 MIL/uL — ABNORMAL LOW (ref 3.87–5.11)
RDW: 16.3 % — ABNORMAL HIGH (ref 11.5–15.5)
WBC: 9.4 10*3/uL (ref 4.0–10.5)
nRBC: 1.4 % — ABNORMAL HIGH (ref 0.0–0.2)

## 2022-05-11 LAB — CULTURE, RESPIRATORY W GRAM STAIN

## 2022-05-11 LAB — MAGNESIUM: Magnesium: 2 mg/dL (ref 1.7–2.4)

## 2022-05-11 LAB — PHOSPHORUS: Phosphorus: 3.7 mg/dL (ref 2.5–4.6)

## 2022-05-11 MED ORDER — DEXTROSE 50 % IV SOLN
INTRAVENOUS | Status: AC
Start: 2022-05-11 — End: 2022-05-11
  Administered 2022-05-11: 50 mL
  Filled 2022-05-11: qty 50

## 2022-05-11 MED ORDER — KCL IN DEXTROSE-NACL 20-5-0.45 MEQ/L-%-% IV SOLN
INTRAVENOUS | Status: DC
  Filled 2022-05-11 (×4): qty 1000

## 2022-05-11 MED ORDER — THIAMINE HCL 100 MG PO TABS: 100.0000 mg | ORAL_TABLET | Freq: Every day | ORAL | Status: DC

## 2022-05-11 MED ORDER — POTASSIUM CHLORIDE 20 MEQ PO PACK
40.0000 meq | PACK | Freq: Once | ORAL | Status: AC
  Administered 2022-05-11: 40 meq
  Filled 2022-05-11: qty 2

## 2022-05-11 MED ORDER — FREE WATER
100.0000 mL | Status: DC
  Administered 2022-05-11: 100 mL

## 2022-05-11 MED ORDER — ADULT MULTIVITAMIN W/MINERALS CH: 1.0000 | ORAL_TABLET | Freq: Every day | ORAL | Status: DC

## 2022-05-11 MED ORDER — ASCORBIC ACID 500 MG PO TABS
500.0000 mg | ORAL_TABLET | Freq: Two times a day (BID) | ORAL | Status: DC
  Administered 2022-05-12: 500 mg via ORAL
  Filled 2022-05-11: qty 1

## 2022-05-11 MED ORDER — ORAL CARE MOUTH RINSE: 15.0000 mL | Freq: Two times a day (BID) | OROMUCOSAL | Status: DC

## 2022-05-11 MED ORDER — FOLIC ACID 1 MG PO TABS: 1.0000 mg | ORAL_TABLET | Freq: Every day | ORAL | Status: DC

## 2022-05-11 NOTE — Progress Notes (Signed)
Inpatient Diabetes Program Recommendations ? ?AACE/ADA: New Consensus Statement on Inpatient Glycemic Control  ? ?Target Ranges:  Prepandial:   less than 140 mg/dL ?     Peak postprandial:   less than 180 mg/dL (1-2 hours) ?     Critically ill patients:  140 - 180 mg/dL  ? ? Latest Reference Range & Units 05/11/22 03:45 05/11/22 03:46 05/11/22 04:08 05/11/22 07:52  ?Glucose-Capillary 70 - 99 mg/dL 42 (LL) 38 (LL) 527 (H) 151 (H)  ? ? Latest Reference Range & Units 05/10/22 08:00 05/10/22 12:07 05/10/22 15:44 05/10/22 19:58 05/10/22 23:29  ?Glucose-Capillary 70 - 99 mg/dL 82 83 782 (H) 423 (H) 536 (H)  ?(H): Data is abnormally high ?Review of Glycemic Control ? ?Diabetes history: DM2 ?Outpatient Diabetes medications: none ?Current orders for Inpatient glycemic control: Novolog 0-15 units Q4H ? ?Inpatient Diabetes Program Recommendations:   ? ?Insulin: May want to consider decreasing Novolog correction to 0-9 units Q4H. ? ?Thanks, ?Orlando Penner, RN, MSN, CDE ?Diabetes Coordinator ?Inpatient Diabetes Program ?[phone] (Team Pager from 8am to 5pm) ? ? ?

## 2022-05-11 NOTE — Progress Notes (Signed)
[place] ADULT ICU REPLACEMENT PROTOCOL ? ? ?The patient does apply for the [facility] Adult ICU Electrolyte Replacment Protocol based on the criteria listed below:  ? ?1.Exclusion criteria: TCTS patients, ECMO patients, and Dialysis patients ?2. Is GFR >/= 30 ml/min? Yes.    ?Patient's GFR today is >60 ?3. Is SCr </= 2? Yes.   ?Patient's SCr is 0.65 mg/dL ?4. Did SCr increase >/= 0.5 in 24 hours? No. ?5.Pt's weight >40kg  Yes.   ?6. Abnormal electrolyte(s): K+ 3.5  ?7. Electrolytes replaced per protocol ?8.  Call MD STAT for K+ </= 2.5, Phos </= 1, or Mag </= 1 ?Physician:  Roxan Hockey ? ?Genelle Bal 05/11/2022 6:52 AM  ?

## 2022-05-11 NOTE — Progress Notes (Signed)
Unable to complete [facility] Swallow Screen at this time.  MD notified. ?

## 2022-05-11 NOTE — Progress Notes (Signed)
? ?NAME:  Katie Young, MRN:  [id number], DOB:  01/22/1976, LOS: 3 ?ADMISSION DATE:  05/08/2022, CONSULTATION DATE:  05/08/22 ?REFERRING MD:  Jearld Lesch, MD, CHIEF COMPLAINT:  GI Bleed  ? ?History of Present Illness:  ?46 year old female with current tobacco/EtOH abuse who presents with altered mental status and reported hematemesis described as black blood x 2 days by the family who presented to [facility]. No further episodes of hematemesis on arrival however was profoundly hypotensive to the 7s. She received 3L IVF and started of vasopressors. R IJ central line placed. Last Hg 10.8, similar to prior labs she had there. K 3.1. BUN/Cr 78/2.49. AST 47 ALT 48 AP 127. PT/INR 11/1.08.  ? ?Of note she has had an EGD with [facility] in [place] in 12/2021 that demonstrated esophagitis but no varices or ulcers.   ? ?Per her daughter Katie Young, she lives with her boyfriend.  Katie Young had not seen her in several weeks.  Was recently in rehab and discharged to home about 2 months ago.  Unfortunately she started drinking heavily again after her discharge.  Today when Katie Young went to check on her she found her covered in black coffee ground emesis and lethargic.  She was awake enough  to refuse transfer to the ED from paramedics at that time.   ? ?Pertinent  Medical History  ?EtOH use, CKD, anxiety, hx stroke, COPD on oxygen, DM2, HLD, HTN, GERD ? ?Significant Hospital Events: ?Including procedures, antibiotic start and stop dates in addition to other pertinent events   ?5/14 Admitted to [facility] from [facility] for GI bleed ?Head CT at OSH: No acute intracranial pathology. ?Possible sinusitis  ?5/15  Sedated on vent with no acute complications since admit overnight  ?5/16 Some issues with bradycardia overnight likely secondary to sedation ?5/17 no major issues overnight, she I laert and interactive on vent this am.  ? ?Interim History / Subjective:  ?Able to follow simple commands on vent this am  ? ?Objective   ?Blood  pressure (!) 109/59, pulse 71, temperature 99.1 ?F (37.3 ?C), temperature source Axillary, resp. rate 18, height 5\' 9"  (1.753 m), weight 70.3 kg, last menstrual period 03/09/2014, SpO2 100 %. ?   ?Vent Mode: PSV;CPAP ?FiO2 (%):  [30 %] 30 % ?Set Rate:  [18 bmp] 18 bmp ?Vt Set:  [530 mL] 530 mL ?PEEP:  [5 cmH20] 5 cmH20 ?Pressure Support:  [5 cmH20] 5 cmH20 ?Plateau Pressure:  [16 cmH20-18 cmH20] 17 cmH20  ? ?Intake/Output Summary (Last 24 hours) at 05/11/2022 0753 ?Last data filed at 05/11/2022 0530 ?Gross per 24 hour  ?Intake 2643.7 ml  ?Output 980 ml  ?Net 1663.7 ml  ? ? ?Filed Weights  ? 05/09/22 0434 05/10/22 0247 05/11/22 0500  ?Weight: 64.8 kg 66.3 kg 70.3 kg  ? ? ?Examination: ?General: Chronically ill appearing adult female lying in bed on mechanical ventilation, in NAD ?HEENT: ETT, MM pink/moist, PERRL,  ?Neuro: Alert and interactive on vent, seen moving all extremities spontaneously  ?CV: s1s2 regular rate and rhythm, no murmur, rubs, or gallops,  ?PULM:  Clear to ascultation, no increased work of breathing, no added breath sounds ?GI: soft, bowel sounds active in all 4 quadrants, non-tender, non-distended, tolerating TF ?Extremities: warm/dry, no edema  ?Skin: no rashes or lesions ? ?Resolved Hospital Problem list   ?AKI ? ?Assessment & Plan:  ?Acute metabolic encephalopathy - improved  ?-Intoxication vs sepsis vs hepatic encephalopathy?  ?-Head CT at OSH negative  ?Hx seizures since childhood ?  P: ?Maintain neuro protective measures ?Nutrition and bowel regiment  ?Seizure precautions  ?Aspirations precautions  ?Minimize sedation  ? ?Hematemesis secondary to suspected upper GI bleed ?-EGD with [facility] in [place] in 12/2021 that demonstrated esophagitis but no varices or ulcers.   ?Hx EtOH abuse ?P: ?GI evaluated during admit with no indication for scope at this time  ?Continue to trend CBC ?Continue PPI BID  ?Octreotide stopped 5/16 ?Follow CIWA  ?Supplement folate, thimaine, and MVI  ? ?Acute hypoxemic  respiratory failure ?-Concern for possible overdose/intoxication vs aspiration event in the setting of GI bleed  ?COPD on home O2 ?Tobacco abuse ?Concern for aspiration PNA ?P: ?Alert and interactive on vent if passes SBT can likely extubate today  ?Continue ventilator support with lung protective strategies  ?Wean PEEP and FiO2 for sats greater than 90%. ?Head of bed elevated 30 degrees. ?Plateau pressures less than 30 cm H20.  ?Follow intermittent chest x-ray and ABG.   ?SAT/SBT as tolerated, mentation preclude extubation  ?Ensure adequate pulmonary hygiene  ?Follow cultures  ?VAP bundle in place  ?PAD protocol ?Plan for 5 days IV Unasyn  ? ?Hypotension  ?-Hypovolemia vs sepsis vs acute bleeding vs sedation. 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 ?Chronic left lower extremity osteomyelitis ?-Per chart review LLE wound dated back to 2013 after undergoing ORIF to ankle fracture   ?-Per chart review it appears that patient last saw wound care 10/05/2021 at which time Augmentin was prescribed and dressing orders placed   ?-On admission wound appeared to be infested with maggots  ?P: ?Local wound care  ?Antibiotics as above  ? ?Hypernatremia  ?-1L free water deficit  ?Hypokalemia ?Hypophosphoremia  ?P: ?Trend Bmet  ?Supplement as needed  ? ?Anemia in the setting of acute GI bleed with Iron deficiency  ?P: ?Supplemented IV iron 5/16 ?Hgb goal >7 ? ?Best Practice (right click and "Reselect all SmartList Selections" daily)  ? ?Diet/type: NPO ?DVT prophylaxis: other contraindicated ?GI prophylaxis: PPI ?Lines: Central line ?Foley:  Yes, and it is still needed ?Code Status:  full code ?Last date of multidisciplinary goals of care discussion: NO family at bedside currently will update on arrival  ? ?Critical care time:   ? ?Performed by: Deazia Lampi D. Harris ? ?Total critical  care time: 37 minutes ? ?Critical care time was exclusive of separately billable procedures and treating other patients. ? ?Critical care was necessary to treat or prevent imminent or life-threatening deterioration. ? ?Critical care was time spent personally by me on the following activities: development of treatment plan with patient and/or surrogate as well as nursing, discussions with consultants, evaluation of patient's response to treatment, examination of patient, obtaining history from patient or surrogate, ordering and performing treatments and interventions, ordering and review of laboratory studies, ordering and review of radiographic studies, pulse oximetry and re-evaluation of patient's condition. ? ?Jamyron Redd D. Harris, NP-C ?[facility] ?Personal contact information can be found on Amion  ?05/11/2022, 7:53 AM ? ? ? ? ? ? ?

## 2022-05-11 NOTE — Procedures (Signed)
Extubation Procedure Note ? ?Patient Details:   ?Name: DEVETTA HAGENOW ?DOB: [birth year] ?MRN: [id number] ?  ?Airway Documentation:  ?  ?Vent end date: 05/11/22 Vent end time: 0924  ? ?Evaluation ? O2 sats: stable throughout ?Complications: No apparent complications ?Patient did tolerate procedure well. ?Bilateral Breath Sounds: Diminished ?  ?Yes ? ?Suzan Garibaldi ?05/11/2022, 9:24 AM ? ?

## 2022-05-11 NOTE — Progress Notes (Signed)
Pharmacy Antibiotic Note ? ?Katie Young is a 46 y.o. female transferred from [facility] to [facility] on 05/08/2022 with altered mental status, hematemesis, hypotension. Pharmacy has been consulted for Unasyn dosing for aspiration pneumonia. SCr at [facility] 2.49. Renal function improved. ? ?Plan: ?Continue Unasyn 3 g IV q6h. Plan for 5 days of therapy. ? ?Pharmacy to sign off consult but will continue to follow renal function, cultures and clinical progress for antibiotic dosage adjustments or de-escalation as indicated. ? ?Height: 5\' 9"  (175.3 cm) ?Weight: 70.3 kg (154 lb 15.7 oz) ?IBW/kg (Calculated) : 66.2 ? ? ?Allergies  ?Allergen Reactions  ? Sulfa Antibiotics Nausea And Vomiting  ? ? ?Antimicrobials this admission: ?5/14 Vancomycin x 1 ?5/14 Cefepime x 1 ?5/14 Unasyn >> ? ?Microbiology results: ?5/14 BCx: ngtd ?5/14 Tracheal aspirate: proteus mirabilis, pending sensitivities ?5/14 MRSA PCR: negative  ?5/14 Strep pneumo urinary antigen: negative ?5/14 Legionella urinary antigen: negative ? ?Thank you for allowing pharmacy to be a part of this patient?s care. ? ? ?6/14, PharmD, BCPS ?Clinical Pharmacist ?05/11/2022 8:03 AM ? ? ?

## 2022-05-12 ENCOUNTER — Inpatient Hospital Stay (HOSPITAL_COMMUNITY): Payer: Medicaid Other

## 2022-05-12 DIAGNOSIS — J9601 Acute respiratory failure with hypoxia: Secondary | ICD-10-CM | POA: Diagnosis not present

## 2022-05-12 DIAGNOSIS — K922 Gastrointestinal hemorrhage, unspecified: Secondary | ICD-10-CM | POA: Diagnosis not present

## 2022-05-12 LAB — GLUCOSE, CAPILLARY
Glucose-Capillary: 100 mg/dL — ABNORMAL HIGH (ref 70–99)
Glucose-Capillary: 133 mg/dL — ABNORMAL HIGH (ref 70–99)
Glucose-Capillary: 142 mg/dL — ABNORMAL HIGH (ref 70–99)
Glucose-Capillary: 159 mg/dL — ABNORMAL HIGH (ref 70–99)
Glucose-Capillary: 70 mg/dL (ref 70–99)
Glucose-Capillary: 88 mg/dL (ref 70–99)
Glucose-Capillary: 117 mg/dL — ABNORMAL HIGH (ref 70–99)
Glucose-Capillary: 131 mg/dL — ABNORMAL HIGH (ref 70–99)

## 2022-05-12 LAB — CBC
HCT: 24.1 % — ABNORMAL LOW (ref 36.0–46.0)
Hemoglobin: 7.2 g/dL — ABNORMAL LOW (ref 12.0–15.0)
MCH: 32.3 pg (ref 26.0–34.0)
MCHC: 29.9 g/dL — ABNORMAL LOW (ref 30.0–36.0)
MCV: 108.1 fL — ABNORMAL HIGH (ref 80.0–100.0)
Platelets: 98 10*3/uL — ABNORMAL LOW (ref 150–400)
RBC: 2.23 MIL/uL — ABNORMAL LOW (ref 3.87–5.11)
RDW: 16.4 % — ABNORMAL HIGH (ref 11.5–15.5)
WBC: 5.7 10*3/uL (ref 4.0–10.5)
nRBC: 0.7 % — ABNORMAL HIGH (ref 0.0–0.2)

## 2022-05-12 LAB — COMPREHENSIVE METABOLIC PANEL
ALT: 25 U/L (ref 0–44)
AST: 23 U/L (ref 15–41)
Albumin: 1.7 g/dL — ABNORMAL LOW (ref 3.5–5.0)
Alkaline Phosphatase: 122 U/L (ref 38–126)
Anion gap: 6 (ref 5–15)
BUN: 7 mg/dL (ref 6–20)
CO2: 30 mmol/L (ref 22–32)
Calcium: 8.1 mg/dL — ABNORMAL LOW (ref 8.9–10.3)
Chloride: 113 mmol/L — ABNORMAL HIGH (ref 98–111)
Creatinine, Ser: 0.58 mg/dL (ref 0.44–1.00)
GFR, Estimated: >60 mL/min (ref >60)
Glucose, Bld: 104 mg/dL — ABNORMAL HIGH (ref 70–99)
Potassium: 3.6 mmol/L (ref 3.5–5.1)
Sodium: 149 mmol/L — ABNORMAL HIGH (ref 135–145)
Total Bilirubin: 0.7 mg/dL (ref 0.3–1.2)
Total Protein: 5 g/dL — ABNORMAL LOW (ref 6.5–8.1)

## 2022-05-12 LAB — AMMONIA: Ammonia: 13 umol/L (ref 9–35)

## 2022-05-12 MED ORDER — CHLORHEXIDINE GLUCONATE CLOTH 2 % EX PADS
6.0000 | MEDICATED_PAD | Freq: Every day | CUTANEOUS | Status: DC
  Administered 2022-05-13 – 2022-05-14 (×3): 6 via TOPICAL

## 2022-05-12 MED ORDER — FOLIC ACID 1 MG PO TABS
1.0000 mg | ORAL_TABLET | Freq: Every day | ORAL | Status: DC
  Administered 2022-05-13 – 2022-05-18 (×6): 1 mg
  Filled 2022-05-12 (×6): qty 1

## 2022-05-12 MED ORDER — SODIUM CHLORIDE 0.9 % IV SOLN
125.0000 mg | Freq: Once | INTRAVENOUS | Status: AC
  Administered 2022-05-12: 125 mg via INTRAVENOUS
  Filled 2022-05-12: qty 10

## 2022-05-12 MED ORDER — BUDESONIDE 0.5 MG/2ML IN SUSP
0.5000 mg | Freq: Two times a day (BID) | RESPIRATORY_TRACT | Status: DC
  Administered 2022-05-12 – 2022-05-18 (×14): 0.5 mg via RESPIRATORY_TRACT
  Filled 2022-05-12 (×14): qty 2

## 2022-05-12 MED ORDER — POTASSIUM CHLORIDE 10 MEQ/50ML IV SOLN
10.0000 meq | INTRAVENOUS | Status: AC
  Administered 2022-05-12 (×4): 10 meq via INTRAVENOUS
  Filled 2022-05-12 (×4): qty 50

## 2022-05-12 MED ORDER — HYDROCORTISONE SOD SUC (PF) 100 MG IJ SOLR
100.0000 mg | Freq: Three times a day (TID) | INTRAMUSCULAR | Status: DC
Start: 2022-05-12 — End: 2022-05-14
  Administered 2022-05-12 – 2022-05-14 (×6): 100 mg via INTRAVENOUS
  Filled 2022-05-12 (×6): qty 2

## 2022-05-12 MED ORDER — ORAL CARE MOUTH RINSE
15.0000 mL | Freq: Two times a day (BID) | OROMUCOSAL | Status: DC
  Administered 2022-05-12: 15 mL via OROMUCOSAL

## 2022-05-12 MED ORDER — ADULT MULTIVITAMIN LIQUID CH
15.0000 mL | Freq: Every day | ORAL | Status: DC
  Administered 2022-05-13: 15 mL
  Filled 2022-05-12: qty 15

## 2022-05-12 MED ORDER — LACTATED RINGERS IV BOLUS
1000.0000 mL | Freq: Once | INTRAVENOUS | Status: AC
  Administered 2022-05-12: 1000 mL via INTRAVENOUS

## 2022-05-12 MED ORDER — ASCORBIC ACID 500 MG PO TABS
500.0000 mg | ORAL_TABLET | Freq: Two times a day (BID) | ORAL | Status: DC
  Administered 2022-05-13 – 2022-05-16 (×6): 500 mg
  Filled 2022-05-12 (×6): qty 1

## 2022-05-12 MED ORDER — CHLORHEXIDINE GLUCONATE 0.12 % MT SOLN
15.0000 mL | Freq: Two times a day (BID) | OROMUCOSAL | Status: DC
  Administered 2022-05-12 – 2022-05-13 (×3): 15 mL via OROMUCOSAL
  Filled 2022-05-12 (×2): qty 15

## 2022-05-12 MED ORDER — THIAMINE HCL 100 MG PO TABS
100.0000 mg | ORAL_TABLET | Freq: Every day | ORAL | Status: DC
  Administered 2022-05-13 – 2022-05-18 (×6): 100 mg
  Filled 2022-05-12 (×6): qty 1

## 2022-05-12 NOTE — Progress Notes (Signed)
Pt's temp 96.3 axillary. Bair hugger placed on pt.

## 2022-05-12 NOTE — Progress Notes (Signed)
   05/12/22 0800  Pre-Screen Questions- If "YES" to any of the following questions, STOP the screen, keep NPO, and place order for SLP eval and treat   Home diet required thickened liquids No  Trach tube present No  Radiation to Head/Neck No  Patient Readiness for Screen - If "YES" to any of the following questions, WAIT to screen, keep NPO  Is patient lethargic or unable to stay alert/awake? (!) Yes    [facility] Swallow screen deferred. Pt confused/agitated/lethargic. Unable to attempt. Will evaluate and administer when appropriate.
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
## 2022-05-12 NOTE — Progress Notes (Signed)
Pt's axillary temp 98.5. Bair hugger removed.

## 2022-05-12 NOTE — Progress Notes (Signed)
MEDICATION RELATED CONSULT NOTE Pharmacy - IV Iron Dosing  Assessment: Iron/TIBC/Ferritin/ %Sat    Component Value Date/Time   IRON 18 (L) 05/10/2022 0420   TIBC 210 (L) 05/10/2022 0420   FERRITIN 112 05/10/2022 0420   IRONPCTSAT 9 (L) 05/10/2022 0420   Plan: Ferric gluconate 125 mg IV x 1 given 5/16 Repeat Ferric gluconate 125 mg IV x 1 again today Pharmacy to follow-up if additional doses needed after 2-3 days Consider PO iron supplementation once able to take po  Herby Abraham, Pharm.D 05/12/2022 10:12 AM

## 2022-05-12 NOTE — Progress Notes (Addendum)
Small bore gastric tube placed in left nares for nutrition supplementation.     Patient's daughter, Marcelino Duster, updated on patient status and plan of care for the day.  Questions answered, support offered.      Canary Brim, MSN, APRN, NP-C, AGACNP-BC [facility] 05/12/2022, 11:20 AM   Please see [email or web address] for pager details.   From 7A-7P if no response, please call [phone] After hours, please call ELink [phone]

## 2022-05-12 NOTE — Progress Notes (Signed)
[facility] ADULT ICU REPLACEMENT PROTOCOL   The patient does apply for the [facility] Adult ICU Electrolyte Replacment Protocol based on the criteria listed below:   1.Exclusion criteria: TCTS patients, ECMO patients, and Dialysis patients 2. Is GFR >/= 30 ml/min? Yes.    Patient's GFR today is >60 3. Is SCr </= 2? Yes.   Patient's SCr is 0.58 mg/dL 4. Did SCr increase >/= 0.5 in 24 hours? No. 5.Pt's weight >40kg  Yes.   6. Abnormal electrolyte(s): K+ 3.6  7. Electrolytes replaced per protocol 8.  Call MD STAT for K+ </= 2.5, Phos </= 1, or Mag </= 1 Physician:  n/a  Melvern Banker 05/12/2022 6:18 AM

## 2022-05-12 NOTE — Progress Notes (Addendum)
NAME:  Katie Young, MRN:  [id number], DOB:  05/04/1976, LOS: 4 ADMISSION DATE:  05/08/2022, CONSULTATION DATE:  05/08/22 REFERRING MD:  Jearld Lesch, MD, CHIEF COMPLAINT:  GI Bleed   History of Present Illness:  46 year old female with current tobacco/EtOH abuse admitted 5/14 with altered mental status and reported hematemesis described as black blood x 2 days by the family who presented to [facility]. No further episodes of hematemesis on arrival however was profoundly hypotensive to the 12s. She received 3L IVF and started of vasopressors. R IJ central line placed. Last Hgb 10.8, similar to prior labs she had there. K 3.1. BUN/Cr 78/2.49. AST 47 ALT 48 AP 127. PT/INR 11/1.08.   Of note she has had an EGD with [facility] in [place] in 12/2021 that demonstrated esophagitis but no varices or ulcers.    Per her daughter [name], she lives with her boyfriend.  [name] had not seen her in several weeks.  Was recently in rehab and discharged to home about 2 months ago.  Unfortunately she started drinking heavily again after her discharge.  Today when [name] went to check on her she found her covered in black coffee ground emesis and lethargic.  She was awake enough to refuse transfer to the ED from paramedics at that time.    Pertinent  Medical History  EtOH use, CKD, anxiety, hx stroke, COPD on oxygen, DM2, HLD, HTN, GERD  Significant Hospital Events: Including procedures, antibiotic start and stop dates in addition to other pertinent events   5/14 Admitted to [facility] from [facility] for GI bleed Head CT at OSH: No acute intracranial pathology. Possible sinusitis  5/15  Sedated on vent with no acute complications since admit overnight  5/16 Some issues with bradycardia overnight likely secondary to sedation 5/17 no major issues overnight, alert and interactive on vent this am. Extubated.  Interim History / Subjective:  Afebrile  Episodes of agitation/kicking at staff overnight.  Asking  for her beer.  BC pending, ngtd. Note proteus in sputum. Glucose range 83-169 I/O 550 ml UOP, +383 ml in last 24 hours D5 abx   Objective   Blood pressure 135/74, pulse (!) 106, temperature 98.4 F (36.9 C), temperature source Axillary, resp. rate (!) 25, height 5\' 9"  (1.753 m), weight 68.3 kg, last menstrual period 03/09/2014, SpO2 98 %.    Vent Mode: PSV;CPAP FiO2 (%):  [30 %] 30 % PEEP:  [5 cmH20] 5 cmH20 Pressure Support:  [5 cmH20] 5 cmH20   Intake/Output Summary (Last 24 hours) at 05/12/2022 0715 Last data filed at 05/12/2022 0419 Gross per 24 hour  Intake 933.05 ml  Output 550 ml  Net 383.05 ml   Filed Weights   05/10/22 0247 05/11/22 0500 05/12/22 0434  Weight: 66.3 kg 70.3 kg 68.3 kg    Examination: General: disheveled adult female lying in bed in NAD HEENT: MM pink/dry, anicteric, pupils =/reactive Neuro: awakens to voice, moves all extremities, does not follow commands, moves away from provider when attempts to open eyes for pupil check CV: s1s2 RRR, no m/r/g PULM: non-labored at rest, lungs bilaterally with soft wheeze, rhonchi  GI: soft, bsx4 active  Extremities: warm/dry, trace dependent edema  Skin: multiple abrasions to feet/lower extremities, LLE wrapped in gauze. Multiple tattoos  Resolved Hospital Problem list   AKI  Assessment & Plan:   Acute metabolic encephalopathy   Hx seizures since childhood Intoxication vs sepsis vs hepatic encephalopathy.  ? Component of ETOH withdrawal. Head CT at  OSH negative  -assess ammonia (although LFT's normal)  -minimize ativan as able  -follow neuro exam  -seizure precautions   Hematemesis secondary to suspected upper GI bleed ETOH Abuse EGD with [facility] in [place] in 12/2021 that demonstrated esophagitis but no varices or ulcers.  Octreotide stopped 5/16.  -no further bleeding noted since admit  -GI saw during admit > rec's for no scope at this time  -follow H/H -BID PPI  -folate, thiamine, MVI   Acute  hypoxemic respiratory failure COPD on home O2 Tobacco abuse Aspiration PNA / Proteus Mirabilis (pan sensitive) Concern for possible overdose/intoxication vs aspiration event in the setting of GI bleed.  -wean O2 for sats >90% -D5 ABX, unasyn -pulmonary hygiene -IS, mobilize as able   Hypotension  Suspect multifactorial in setting of hypovolemia, component of possible bleeding prior to admit, sedation, relative AI & sepsis. 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 -sensitive to ativan/sedating medications  -wean levophed to off for MAP >65 -1L LR bolus now  -add stress dose steroids   Chronic left lower extremity osteomyelitis Per chart review LLE wound dated back to 2013 after undergoing ORIF to ankle fracture. Patient last saw wound care 10/05/2021 at which time Augmentin was prescribed and dressing orders placed. On admission wound appeared to be infested with maggots  -WOC following, local wound care  -abx as above  -dressing changes / keep clean & dry  Hypernatremia  Hypokalemia Hypophosphoremia  -1L LR now  -follow BMP  -monitor electrolytes and replace as indicated   Anemia in the setting of acute GI bleed with Iron deficiency  Iron-follow Hgb/Hct  -transfuse for Hgb <7%  -folate/thiamine/MVI  Thrombocytopenia  Suspect in setting of ETOH abuse -follow trend, no indication for transfusion at this time   At Risk Malnutrition  -place nasogastric tube, begin nutrition this afternoon if mental status not improved  Best Practice (right click and "Reselect all SmartList Selections" daily)  Diet/type: NPO DVT prophylaxis: other contraindicated GI prophylaxis: PPI Lines: Central line Foley:  Yes, and it is still needed Code Status:  full code Last date of multidisciplinary goals of care discussion: Will update family on arrival 5/18   Critical care time: 28 minutes    Noe Gens, MSN, APRN, NP-C, AGACNP-BC [facility] 05/12/2022, 7:15 AM   Please see [email or web address] for pager details.   From 7A-7P if no response, please call [phone] After hours, please call ELink [phone]

## 2022-05-12 NOTE — Progress Notes (Signed)
   05/12/22 2100  Pre-Screen Questions- If "YES" to any of the following questions, STOP the screen, keep NPO, and place order for SLP eval and treat   Home diet required thickened liquids No  Trach tube present No  Radiation to Head/Neck No  Patient Readiness for Screen - If "YES" to any of the following questions, WAIT to screen, keep NPO  Is patient lethargic or unable to stay alert/awake? (!) Yes   Pt lethargic and confused, becomes agitated when stimulated. Cannot answer orientation questions, just thrashes and attempts to scream. Will defer swallow screen until pt is appropriate.

## 2022-05-12 NOTE — Progress Notes (Signed)
Pt's axillary temp 96.3. Bair hugger placed on pt.

## 2022-05-12 NOTE — Progress Notes (Signed)
PT Cancellation Note  Patient Details Name: EMMELY LAINO MRN: [id number] DOB: 06-Aug-1976   Cancelled Treatment:    Reason Eval/Treat Not Completed: Medical issues which prohibited therapy, will check back tomorrow.   Claretha Cooper 05/12/2022, 12:39 PM [place] Pager [phone] Office [phone]
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
## 2022-05-13 ENCOUNTER — Inpatient Hospital Stay (HOSPITAL_COMMUNITY): Payer: Medicaid Other

## 2022-05-13 DIAGNOSIS — L03116 Cellulitis of left lower limb: Secondary | ICD-10-CM | POA: Diagnosis not present

## 2022-05-13 DIAGNOSIS — D696 Thrombocytopenia, unspecified: Secondary | ICD-10-CM

## 2022-05-13 DIAGNOSIS — K922 Gastrointestinal hemorrhage, unspecified: Secondary | ICD-10-CM | POA: Diagnosis not present

## 2022-05-13 DIAGNOSIS — E440 Moderate protein-calorie malnutrition: Secondary | ICD-10-CM | POA: Diagnosis not present

## 2022-05-13 LAB — GLUCOSE, CAPILLARY
Glucose-Capillary: 121 mg/dL — ABNORMAL HIGH (ref 70–99)
Glucose-Capillary: 126 mg/dL — ABNORMAL HIGH (ref 70–99)
Glucose-Capillary: 135 mg/dL — ABNORMAL HIGH (ref 70–99)
Glucose-Capillary: 138 mg/dL — ABNORMAL HIGH (ref 70–99)
Glucose-Capillary: 147 mg/dL — ABNORMAL HIGH (ref 70–99)
Glucose-Capillary: 131 mg/dL — ABNORMAL HIGH (ref 70–99)

## 2022-05-13 LAB — COMPREHENSIVE METABOLIC PANEL
ALT: 24 U/L (ref 0–44)
AST: 21 U/L (ref 15–41)
Albumin: 1.7 g/dL — ABNORMAL LOW (ref 3.5–5.0)
Alkaline Phosphatase: 133 U/L — ABNORMAL HIGH (ref 38–126)
Anion gap: 3 — ABNORMAL LOW (ref 5–15)
BUN: 12 mg/dL (ref 6–20)
CO2: 30 mmol/L (ref 22–32)
Calcium: 8 mg/dL — ABNORMAL LOW (ref 8.9–10.3)
Chloride: 109 mmol/L (ref 98–111)
Creatinine, Ser: 0.62 mg/dL (ref 0.44–1.00)
GFR, Estimated: >60 mL/min (ref >60)
Glucose, Bld: 125 mg/dL — ABNORMAL HIGH (ref 70–99)
Potassium: 3.7 mmol/L (ref 3.5–5.1)
Sodium: 142 mmol/L (ref 135–145)
Total Bilirubin: 0.6 mg/dL (ref 0.3–1.2)
Total Protein: 5.1 g/dL — ABNORMAL LOW (ref 6.5–8.1)

## 2022-05-13 LAB — CULTURE, BLOOD (ROUTINE X 2)
Culture: NO GROWTH
Culture: NO GROWTH
Special Requests: ADEQUATE
Special Requests: ADEQUATE

## 2022-05-13 LAB — CBC
HCT: 23.7 % — ABNORMAL LOW (ref 36.0–46.0)
Hemoglobin: 7.4 g/dL — ABNORMAL LOW (ref 12.0–15.0)
MCH: 33.5 pg (ref 26.0–34.0)
MCHC: 31.2 g/dL (ref 30.0–36.0)
MCV: 107.2 fL — ABNORMAL HIGH (ref 80.0–100.0)
Platelets: 93 10*3/uL — ABNORMAL LOW (ref 150–400)
RBC: 2.21 MIL/uL — ABNORMAL LOW (ref 3.87–5.11)
RDW: 16.3 % — ABNORMAL HIGH (ref 11.5–15.5)
WBC: 5.2 10*3/uL (ref 4.0–10.5)
nRBC: 0.6 % — ABNORMAL HIGH (ref 0.0–0.2)

## 2022-05-13 LAB — PHOSPHORUS
Phosphorus: 3.8 mg/dL (ref 2.5–4.6)
Phosphorus: 3.3 mg/dL (ref 2.5–4.6)

## 2022-05-13 LAB — MAGNESIUM
Magnesium: 1.8 mg/dL (ref 1.7–2.4)
Magnesium: 1.8 mg/dL (ref 1.7–2.4)

## 2022-05-13 MED ORDER — ALBUMIN HUMAN 25 % IV SOLN
25.0000 g | Freq: Four times a day (QID) | INTRAVENOUS | Status: AC
  Administered 2022-05-13 (×3): 25 g via INTRAVENOUS
  Filled 2022-05-13 (×3): qty 100

## 2022-05-13 MED ORDER — PROSOURCE TF PO LIQD
45.0000 mL | Freq: Two times a day (BID) | ORAL | Status: DC
  Administered 2022-05-13 – 2022-05-15 (×5): 45 mL
  Filled 2022-05-13 (×5): qty 45

## 2022-05-13 MED ORDER — ADULT MULTIVITAMIN W/MINERALS CH
1.0000 | ORAL_TABLET | Freq: Every day | ORAL | Status: DC
  Administered 2022-05-14 – 2022-05-16 (×3): 1
  Filled 2022-05-13 (×3): qty 1

## 2022-05-13 MED ORDER — LORAZEPAM 2 MG/ML IJ SOLN: 1.0000 mg | INTRAMUSCULAR | Status: DC | PRN

## 2022-05-13 MED ORDER — VITAL AF 1.2 CAL PO LIQD
1000.0000 mL | ORAL | Status: DC
  Administered 2022-05-13 – 2022-05-16 (×3): 1000 mL

## 2022-05-13 NOTE — Progress Notes (Signed)
PT Cancellation Note  Patient Details Name: Katie Young MRN: [id number] DOB: August 22, 1976   Cancelled Treatment:    Reason Eval/Treat Not Completed: Medical issues which prohibited therapy, in 4 pont restraints [place] Pager [phone] Office [phone]    Claretha Cooper 05/13/2022, 10:30 AM

## 2022-05-13 NOTE — Progress Notes (Signed)
NAME:  Katie Young, MRN:  [id number], DOB:  [birth year], LOS: 5 ADMISSION DATE:  05/08/2022, CONSULTATION DATE:  05/08/22 REFERRING MD:  Romie Minus, MD, CHIEF COMPLAINT:  GI Bleed   History of Present Illness:  46 year old F with current tobacco/EtOH abuse admitted 5/14 with altered mental status and reported hematemesis described as black blood x 2 days by the family who presented to [facility]. No further episodes of hematemesis on arrival however was profoundly hypotensive to the 50s. She received 3L IVF and started of vasopressors. R IJ central line placed. Last Hgb 10.8, similar to prior labs she had there. K 3.1. BUN/Cr 78/2.49. AST 47 ALT 48 AP 127. PT/INR 11/1.08.   Of note she has had an EGD with [facility] in [place] in 12/2021 that demonstrated esophagitis but no varices or ulcers.    Per her daughter Marcelino Duster, she lives with her boyfriend.  Marcelino Duster had not seen her in several weeks.  Was recently in rehab and discharged to home about 2 months ago.  Unfortunately she started drinking heavily again after her discharge.  Today when Marcelino Duster went to check on her she found her covered in black coffee ground emesis and lethargic.  She was awake enough to refuse transfer to the ED from paramedics at that time.    Pertinent  Medical History  EtOH use, CKD, anxiety, hx stroke, COPD on oxygen, DM2, HLD, HTN, GERD  Significant Hospital Events: Including procedures, antibiotic start and stop dates in addition to other pertinent events   5/14 Admitted to [facility] from [facility] for GI bleed Head CT at OSH: No acute intracranial pathology. Possible sinusitis  5/15  Sedated on vent with no acute complications since admit overnight  5/16 Some issues with bradycardia overnight likely secondary to sedation 5/17 no major issues overnight, alert and interactive on vent this am. Extubated. 5/18 Agitated, kicking staff overnight, asking for beer.  Gastric tube placed for feeding due to  lethargy  Interim History / Subjective:  Afebrile. Bair hugger used overnight with temp of 96 2L O2  Glucose range 88-131 I/O 625 ml UOP, +1.9L  in last 24 hours  4mg  IV ativan overnight  Objective   Blood pressure 114/82, pulse 75, temperature 98.6 F (37 C), temperature source Axillary, resp. rate (!) 24, height 5\' 9"  (1.753 m), weight 68.3 kg, last menstrual period 03/09/2014, SpO2 99 %.        Intake/Output Summary (Last 24 hours) at 05/13/2022 0705 Last data filed at 05/12/2022 2217 Gross per 24 hour  Intake 2542.15 ml  Output 625 ml  Net 1917.15 ml   Filed Weights   05/10/22 0247 05/11/22 0500 05/12/22 0434  Weight: 66.3 kg 70.3 kg 68.3 kg    Examination: General: ill appearing, disheveled adult female lying in bed in NAD HEENT: MM pink/dry with crusting on lips, poor dentition, anicteric, pupils equal/reactive  Neuro: drowsy, awakens to voice, moves all extremities but does not follow commands, attempts to communicate but dry mouth produces garbled speech  CV: s1s2 RRR, no m/r/g PULM: non-labored at rest, moist cough, lungs bilaterally with rhonchi GI: soft, bsx4 active, nasogastric tube in place Extremities: warm/dry, trace dependent edema  Skin: multiple tattoos, LLE wrapped, multiple abrasions on RLE  Resolved Hospital Problem list   AKI  Assessment & Plan:   Acute Metabolic Encephalopathy   Hx seizures since childhood Suspect multifactorial in setting of intoxication, sepsis & metabolic encephalopathy.  ? Component of ETOH withdrawal. Head CT at  OSH negative.  Ammonia negative.  -change frequency of ativan dosing to Q4, minimize as able -allow patient to wake to prove agitation / med need  -follow serial neuro exam  -seizure precautions  -may need oral valium / librium taper   Hematemesis secondary to suspected upper GI bleed ETOH Abuse EGD with [facility] in [place] in 12/2021 that demonstrated esophagitis but no varices or ulcers.  Octreotide stopped  5/16.  -appreciate GI evaluation > rec's for no acute intervention / scope at this time  -monitor for further bleeding (none since admit) -follow H/H trend  -BID PPI  -continue folate, thiamine, MVI  Acute hypoxemic respiratory failure COPD on home O2 Tobacco abuse Aspiration PNA / Proteus Mirabilis (pan sensitive) Concern for possible overdose/intoxication vs aspiration event in the setting of GI bleed.  -wean O2 for sat >90% -unasyn D5/5, monitor closely after completion  -fever curve / WBC trend improved   -sedating effects of ativan complicating her pulmonary picture   Hypotension  Relative Adrenal Insufficiency  Suspect multifactorial in setting of hypovolemia, component of possible bleeding prior to admit, sedation, relative AI & sepsis. 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 -wean levophed to off for MAP >65  -continue stress dose steroids, D2.  Consider starting taper once off levophed   Chronic left lower extremity osteomyelitis Per chart review LLE wound dated back to 2013 after undergoing ORIF to ankle fracture. Patient last saw wound care 10/05/2021 at which time Augmentin was prescribed and dressing orders placed. On admission wound appeared to be infested with maggots.   -appreciate WOC evaluation, local wound care  -abx as above  -keep LE clean/dry  Hypernatremia  Hypokalemia Hypophosphoremia  -follow BMP, electrolytes  -replace electrolytes as indicated   Anemia in the setting of acute GI bleed with Iron deficiency  -follow CBC  -transfuse for Hgb <7%  -continue MVI, folate, thiamine   Thrombocytopenia  Suspect in setting of ETOH abuse, sepsis  -follow trend, no indication for transfusion   Severe Protein Calorie Malnutrition  Albumin 1.7 -begin TF support per Nutrition  -protein supplementation -monitor I/O's   Best Practice (right click and "Reselect all SmartList Selections" daily)  Diet/type: tubefeeds and NPO DVT  prophylaxis: other contraindicated with hematemesis  GI prophylaxis: PPI Lines: Central line Foley:  Yes, and it is still needed Code Status:  full code Last date of multidisciplinary goals of care discussion: Daughter updated via phone 5/18. She is planning to visit 5/19, will update in person.   Critical care time: 35 minutes    Canary Brim, MSN, APRN, NP-C, AGACNP-BC [facility] 05/13/2022, 7:05 AM   Please see [email or web address] for pager details.   From 7A-7P if no response, please call [phone] After hours, please call ELink [phone]

## 2022-05-13 NOTE — Progress Notes (Addendum)
Nutrition Follow-up  DOCUMENTATION CODES:   Non-severe (moderate) malnutrition in context of social or environmental circumstances  INTERVENTION:   Monitor magnesium, potassium, and phosphorus BID for at least 3 days, MD to replete as needed, as pt is at risk for refeeding syndrome.  -Initiate Vital AF 1.2 @ 20 ml/hr, advance by 10 ml every 12 hours to goal rate of 60 ml/hr via NGT. -45 ml Prosource TF BID -This will provide 1808 kcals, 128g protein and 1167 ml H2O  -1 packet Juven BID via tube, each packet provides 95 calories, 2.5 grams of protein (collagen), and 9.8 grams of carbohydrate (3 grams sugar); also contains 7 grams of L-arginine and L-glutamine, 300 mg vitamin C, 15 mg vitamin E, 1.2 mcg vitamin B-12, 9.5 mg zinc, 200 mg calcium, and 1.5 g  Calcium Beta-hydroxy-Beta-methylbutyrate to support wound healing    -Switched liquid MVI to Multivitamin with minerals daily which can be crushed to go through tube. Liquid MVI is not a complete MVI.  -Continue Vitamin C via tube   -Recommend supplementation of Vitamin D   NUTRITION DIAGNOSIS:   Moderate Malnutrition related to social / environmental circumstances (self-neglect, EtOH abuse) as evidenced by mild fat depletion, moderate muscle depletion.  Ongoing.   GOAL:   Patient will meet greater than or equal to 90% of their needs  Not meeting.  MONITOR:   Labs, Weight trends, TF tolerance, I & O's, Skin  REASON FOR ASSESSMENT:   Consult Assessment of nutrition requirement/status, Enteral/tube feeding initiation and management  ASSESSMENT:   46 year old female with current tobacco/EtOH abuse who presents with altered mental status and reported hematemesis described as black blood x 2 days by the family who presented to [facility]. Admitted for upper GI Bleed.  5/14 admitted, intubated  Patient extubated 5/17. NGT was placed yesterday, tip of tube located in stomach.   Per secure chat message with MD, okay to  restart tube feeds today. Relayed tube feeding advancement plan with RN.  Pt still at high refeeding syndrome risk.  Admission weight: 142 lbs Current weight: 150 lbs  Per nursing documentation, pt with mild BUE and moderate RLE edema.  Medications: Vitamin C, Folic acid, Multivitamin with minerals daily, Thiamine, D5 infusion, Levophed, Ativan  Labs reviewed:  CBGs: 117-147  Diet Order:   Diet Order             Diet NPO time specified  Diet effective now                   EDUCATION NEEDS:   Not appropriate for education at this time  Skin:  Skin Assessment: Skin Integrity Issues: Skin Integrity Issues:: Unstageable, Other (Comment) Unstageable: right, left, buttocks Other: Non-pressure wound to LLE  Last BM:  5/17  Height:   Ht Readings from Last 1 Encounters:  05/08/22 5\' 9"  (1.753 m)    Weight:   Wt Readings from Last 1 Encounters:  05/12/22 68.3 kg    BMI:  Body mass index is 22.24 kg/m.  Estimated Nutritional Needs:   Kcal:  1800-2000  Protein:  100-115g  Fluid:  2L/day   05/14/22, MS, RD, LDN Inpatient Clinical Dietitian Contact information available via Amion

## 2022-05-14 ENCOUNTER — Inpatient Hospital Stay (HOSPITAL_COMMUNITY): Payer: Medicaid Other

## 2022-05-14 DIAGNOSIS — K922 Gastrointestinal hemorrhage, unspecified: Secondary | ICD-10-CM | POA: Diagnosis not present

## 2022-05-14 LAB — COMPREHENSIVE METABOLIC PANEL
ALT: 19 U/L (ref 0–44)
AST: 14 U/L — ABNORMAL LOW (ref 15–41)
Albumin: 2.8 g/dL — ABNORMAL LOW (ref 3.5–5.0)
Alkaline Phosphatase: 100 U/L (ref 38–126)
Anion gap: 3 — ABNORMAL LOW (ref 5–15)
BUN: 21 mg/dL — ABNORMAL HIGH (ref 6–20)
CO2: 32 mmol/L (ref 22–32)
Calcium: 8.9 mg/dL (ref 8.9–10.3)
Chloride: 113 mmol/L — ABNORMAL HIGH (ref 98–111)
Creatinine, Ser: 0.62 mg/dL (ref 0.44–1.00)
GFR, Estimated: >60 mL/min (ref >60)
Glucose, Bld: 140 mg/dL — ABNORMAL HIGH (ref 70–99)
Potassium: 3.5 mmol/L (ref 3.5–5.1)
Sodium: 148 mmol/L — ABNORMAL HIGH (ref 135–145)
Total Bilirubin: 0.5 mg/dL (ref 0.3–1.2)
Total Protein: 5.6 g/dL — ABNORMAL LOW (ref 6.5–8.1)

## 2022-05-14 LAB — CBC
HCT: 20.9 % — ABNORMAL LOW (ref 36.0–46.0)
HCT: 21.2 % — ABNORMAL LOW (ref 36.0–46.0)
Hemoglobin: 6.4 g/dL — CL (ref 12.0–15.0)
Hemoglobin: 6.5 g/dL — CL (ref 12.0–15.0)
MCH: 33.5 pg (ref 26.0–34.0)
MCH: 33.7 pg (ref 26.0–34.0)
MCHC: 30.6 g/dL (ref 30.0–36.0)
MCHC: 30.7 g/dL (ref 30.0–36.0)
MCV: 109.4 fL — ABNORMAL HIGH (ref 80.0–100.0)
MCV: 109.8 fL — ABNORMAL HIGH (ref 80.0–100.0)
Platelets: 72 10*3/uL — ABNORMAL LOW (ref 150–400)
Platelets: 71 10*3/uL — ABNORMAL LOW (ref 150–400)
RBC: 1.91 MIL/uL — ABNORMAL LOW (ref 3.87–5.11)
RBC: 1.93 MIL/uL — ABNORMAL LOW (ref 3.87–5.11)
RDW: 16.7 % — ABNORMAL HIGH (ref 11.5–15.5)
RDW: 16.6 % — ABNORMAL HIGH (ref 11.5–15.5)
WBC: 3.6 10*3/uL — ABNORMAL LOW (ref 4.0–10.5)
WBC: 3.9 10*3/uL — ABNORMAL LOW (ref 4.0–10.5)
nRBC: 0.8 % — ABNORMAL HIGH (ref 0.0–0.2)
nRBC: 0.8 % — ABNORMAL HIGH (ref 0.0–0.2)

## 2022-05-14 LAB — HEMOGLOBIN AND HEMATOCRIT, BLOOD
HCT: 24.8 % — ABNORMAL LOW (ref 36.0–46.0)
Hemoglobin: 8 g/dL — ABNORMAL LOW (ref 12.0–15.0)

## 2022-05-14 LAB — MAGNESIUM
Magnesium: 2 mg/dL (ref 1.7–2.4)
Magnesium: 2 mg/dL (ref 1.7–2.4)

## 2022-05-14 LAB — PREPARE RBC (CROSSMATCH)

## 2022-05-14 LAB — PHOSPHORUS
Phosphorus: 3 mg/dL (ref 2.5–4.6)
Phosphorus: 2.5 mg/dL (ref 2.5–4.6)

## 2022-05-14 LAB — ABO/RH: ABO/RH(D): O POS

## 2022-05-14 MED ORDER — FREE WATER
200.0000 mL | Freq: Four times a day (QID) | Status: DC
Start: 2022-05-14 — End: 2022-05-16
  Administered 2022-05-14 – 2022-05-16 (×8): 200 mL

## 2022-05-14 MED ORDER — SODIUM CHLORIDE 0.9% FLUSH
10.0000 mL | Freq: Two times a day (BID) | INTRAVENOUS | Status: DC
  Administered 2022-05-14 – 2022-05-15 (×4): 10 mL

## 2022-05-14 MED ORDER — SODIUM CHLORIDE 0.9% IV SOLUTION: Freq: Once | INTRAVENOUS | Status: AC

## 2022-05-14 MED ORDER — CHLORHEXIDINE GLUCONATE 0.12 % MT SOLN
15.0000 mL | Freq: Two times a day (BID) | OROMUCOSAL | Status: DC
  Administered 2022-05-14 – 2022-05-18 (×9): 15 mL via OROMUCOSAL
  Filled 2022-05-14 (×9): qty 15

## 2022-05-14 MED ORDER — SODIUM CHLORIDE 0.9% FLUSH: 10.0000 mL | INTRAVENOUS | Status: DC | PRN

## 2022-05-14 MED ORDER — SODIUM CHLORIDE 0.9 % IV SOLN
250.0000 mg | Freq: Once | INTRAVENOUS | Status: AC
  Administered 2022-05-14: 250 mg via INTRAVENOUS
  Filled 2022-05-14: qty 20

## 2022-05-14 MED ORDER — HYDROCORTISONE SOD SUC (PF) 100 MG IJ SOLR: 50.0000 mg | Freq: Two times a day (BID) | INTRAMUSCULAR | Status: DC

## 2022-05-14 MED ORDER — CHLORHEXIDINE GLUCONATE CLOTH 2 % EX PADS
6.0000 | MEDICATED_PAD | Freq: Every day | CUTANEOUS | Status: DC
  Administered 2022-05-15 – 2022-05-16 (×2): 6 via TOPICAL

## 2022-05-14 MED ORDER — ORAL CARE MOUTH RINSE
15.0000 mL | Freq: Two times a day (BID) | OROMUCOSAL | Status: DC
  Administered 2022-05-14 – 2022-05-18 (×10): 15 mL via OROMUCOSAL

## 2022-05-14 MED ORDER — POTASSIUM CHLORIDE 20 MEQ PO PACK
40.0000 meq | PACK | Freq: Once | ORAL | Status: AC
  Administered 2022-05-14: 40 meq
  Filled 2022-05-14: qty 2

## 2022-05-14 NOTE — Progress Notes (Signed)
MEDICATION RELATED CONSULT NOTE Pharmacy - IV Iron Dosing  Assessment: Iron/TIBC/Ferritin/ %Sat    Component Value Date/Time   IRON 18 (L) 05/10/2022 0420   TIBC 210 (L) 05/10/2022 0420   FERRITIN 112 05/10/2022 0420   IRONPCTSAT 9 (L) 05/10/2022 0420      Latest Ref Rng & Units 05/14/2022    5:51 AM 05/14/2022    4:33 AM 05/13/2022    4:05 AM  CBC  WBC 4.0 - 10.5 K/uL 3.9   3.6   5.2    Hemoglobin 12.0 - 15.0 g/dL 6.5   6.4   7.4    Hematocrit 36.0 - 46.0 % 21.2   20.9   23.7    Platelets 150 - 400 K/uL 71   72   93      Cumulative dosing this admission:  5/16 125 mg 5/18 125 mg   Plan: Ferric gluconate 250 mg IV x 1 Pharmacy to follow-up if additional doses needed after 2-3 days Consider enteral iron supplementation if tolerated    Lynann Beaver PharmD, BCPS Clinical Pharmacist [facility] main pharmacy [phone] 05/14/2022 7:26 AM

## 2022-05-14 NOTE — Progress Notes (Signed)
PT Cancellation Note  Patient Details Name: Katie Young MRN: [id number] DOB: Sep 20, 1976   Cancelled Treatment:    Reason Eval/Treat Not Completed: Patient not medically ready (Pt remains in 4-point restraints and hgb is 6.5 with plan for transfusion today. Will sign off on order at this time. Please re-order PT eval when pt is medically able to particiapte in therapy.)   Wynn Maudlin, DPT Acute Rehabilitation Services Office [phone] Pager [phone]  05/14/22 10:19 AM

## 2022-05-14 NOTE — Progress Notes (Addendum)
eLink Physician-Brief Progress Note Patient Name: Katie Young DOB: 12/27/1975 MRN: [id number]   Date of Service  05/14/2022  HPI/Events of Note  Anemia - Hgb = 6.5.   eICU Interventions  Plan: Will transfuse 1 unit PRBC. H/H at 12 noon.     Intervention Category Major Interventions: Other:  [name] Dennard Nip 05/14/2022, 6:50 AM

## 2022-05-14 NOTE — Progress Notes (Signed)
[facility] ADULT ICU REPLACEMENT PROTOCOL   The patient does apply for the [facility] Adult ICU Electrolyte Replacment Protocol based on the criteria listed below:   1.Exclusion criteria: TCTS patients, ECMO patients, and Dialysis patients 2. Is GFR >/= 30 ml/min? Yes.    Patient's GFR today is >60 3. Is SCr </= 2? Yes.   Patient's SCr is 0.62 mg/dL 4. Did SCr increase >/= 0.5 in 24 hours? No. 5.Pt's weight >40kg  Yes.   6. Abnormal electrolyte(s): K+ 3.5  7. Electrolytes replaced per protocol 8.  Call MD STAT for K+ </= 2.5, Phos </= 1, or Mag </= 1 Physician:  Theador Hawthorne 05/14/2022 6:47 AM

## 2022-05-14 NOTE — Progress Notes (Signed)
eLink Physician-Brief Progress Note Patient Name: Katie Young DOB: Oct 23, 1976 MRN: [id number]   Date of Service  05/14/2022  HPI/Events of Note  KUB reviewed.  eICU Interventions  Bedside RN instructed to advance NG tube by 5 cm per radiologist's recommendation.        Thomasene Lot Katie Young 05/14/2022, 11:15 PM

## 2022-05-14 NOTE — Progress Notes (Signed)
Acute mental status change noted by nursing. Right sided facial droop more prominent. She has horizontal nystagmus.   Stat MRI brain ordered.  Melody Comas, MD [facility] Office: [phone]   See Amion for personal pager PCCM on call pager [phone] until 7pm. Please call Elink 7p-7a. [phone]

## 2022-05-14 NOTE — Progress Notes (Addendum)
NAME:  Katie Young, MRN:  [id number], DOB:  Jan 06, 1976, LOS: 6 ADMISSION DATE:  05/08/2022, CONSULTATION DATE:  05/08/22 REFERRING MD:  Romie Minus, MD, CHIEF COMPLAINT:  GI Bleed   History of Present Illness:  46 year old F with current tobacco/EtOH abuse admitted 5/14 with altered mental status and reported hematemesis described as black blood x 2 days by the family who presented to [facility]. No further episodes of hematemesis on arrival however was profoundly hypotensive to the 50s. She received 3L IVF and started of vasopressors. R IJ central line placed. Last Hgb 10.8, similar to prior labs she had there. K 3.1. BUN/Cr 78/2.49. AST 47 ALT 48 AP 127. PT/INR 11/1.08.   Of note she has had an EGD with [facility] in [place] in 12/2021 that demonstrated esophagitis but no varices or ulcers.    Per her daughter Marcelino Duster, she lives with her boyfriend.  Marcelino Duster had not seen her in several weeks.  Was recently in rehab and discharged to home about 2 months ago.  Unfortunately she started drinking heavily again after her discharge.  Today when Marcelino Duster went to check on her she found her covered in black coffee ground emesis and lethargic.  She was awake enough to refuse transfer to the ED from paramedics at that time.    Pertinent  Medical History  EtOH use, CKD, anxiety, hx stroke, COPD on oxygen, DM2, HLD, HTN, GERD  Significant Hospital Events: Including procedures, antibiotic start and stop dates in addition to other pertinent events   5/14 Admitted to [facility] from [facility] for GI bleed Head CT at OSH: No acute intracranial pathology. Possible sinusitis  5/15  Sedated on vent with no acute complications since admit overnight  5/16 Some issues with bradycardia overnight likely secondary to sedation 5/17 no major issues overnight, alert and interactive on vent this am. Extubated. 5/18 Agitated, kicking staff overnight, asking for beer.  Gastric tube placed for feeding due to  lethargy 5/19 no acute events, did not require ativan and no episodes of agitation  Interim History / Subjective:   No acute events overnight. More awake this morning No complaints Received 1 unit PRBCs overnight  Objective   Blood pressure 135/63, pulse 64, temperature (!) 97.3 F (36.3 C), temperature source Axillary, resp. rate (!) 24, height 5\' 9"  (1.753 m), weight 73.5 kg, last menstrual period 03/09/2014, SpO2 99 %.        Intake/Output Summary (Last 24 hours) at 05/14/2022 0844 Last data filed at 05/14/2022 0600 Gross per 24 hour  Intake 3239.6 ml  Output 650 ml  Net 2589.6 ml   Filed Weights   05/11/22 0500 05/12/22 0434 05/14/22 0500  Weight: 70.3 kg 68.3 kg 73.5 kg    Examination: General: chronically ill appearing, no acute distress, resting in bed HEENT: [place]/AT, moist mucous membranes, sclera anicteric, poor dentition Neuro: A&O x 3, moving all extremities spontaneously CV: rrr, s1s2, no murmurs PULM: course breath sounds on right, clear on left. No wheezing GI: soft, non-tender, non-distended, BS+ Extremities: warm, tno edema Skin: LLE wrapped in dressings  Resolved Hospital Problem list   AKI  Assessment & Plan:   Acute Metabolic Encephalopathy   Hx seizures since childhood Suspect multifactorial in setting of intoxication, sepsis & metabolic encephalopathy.  ? Component of ETOH withdrawal. Head CT at OSH negative.  Ammonia negative.  -change frequency of ativan dosing to Q4, minimize as able -allow patient to wake to prove agitation / med need  -follow  serial neuro exam  -seizure precautions  -may need oral valium / librium taper   Hematemesis secondary to suspected upper GI bleed ETOH Abuse EGD with [facility] in [place] in 12/2021 that demonstrated esophagitis but no varices or ulcers.  Octreotide stopped 5/16.  -appreciate GI evaluation > rec's for no acute intervention / scope at this time  -monitor for further bleeding (none since  admit) -follow H/H trend  - Required 1 unit PRBCs this morning -BID PPI  -continue folate, thiamine, MVI  Acute hypoxemic respiratory failure COPD on home O2 Tobacco abuse Aspiration PNA / Proteus Mirabilis (pan sensitive) Concern for possible overdose/intoxication vs aspiration event in the setting of GI bleed.  -wean O2 for sat >90%, can likely come off supplemental O2 today -unasyn completed 5/19  -fever curve / WBC trend improved    Hypotension  Relative Adrenal Insufficiency  Suspect multifactorial in setting of hypovolemia, component of possible bleeding prior to admit, sedation, relative AI & sepsis. 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 -off levophed -continue stress dose steroids, will taper to daily dosing over next couple of days.  Chronic left lower extremity osteomyelitis Per chart review LLE wound dated back to 2013 after undergoing ORIF to ankle fracture. Patient last saw wound care 10/05/2021 at which time Augmentin was prescribed and dressing orders placed. On admission wound appeared to be infested with maggots.   -appreciate WOC evaluation, local wound care  -keep LE clean/dry  Hypernatremia  Hypokalemia Hypophosphoremia  -follow BMP, electrolytes  -replace electrolytes as indicated  - start free water via NG tube  Anemia in the setting of acute GI bleed with Iron deficiency  -follow CBC  -transfuse for Hgb <7%  -continue MVI, folate, thiamine   Thrombocytopenia  Suspect in setting of ETOH abuse, sepsis  -follow trend, no indication for transfusion   Severe Protein Calorie Malnutrition  Albumin 1.7 -continue TF support per Nutrition  -protein supplementation -monitor I/O's   Best Practice (right click and "Reselect all SmartList Selections" daily)  Diet/type: tubefeeds and NPO DVT prophylaxis: other contraindicated with hematemesis  GI prophylaxis: PPI Lines: Central line Foley:  Yes, and it is still needed Code  Status:  full code Last date of multidisciplinary goals of care discussion: Daughter updated 5/19 by Canary Brim  Critical care time: n/a    Melody Comas, MD [facility] Office: [phone]   See Amion for personal pager PCCM on call pager [phone] until 7pm. Please call Elink 7p-7a. [phone]

## 2022-05-15 DIAGNOSIS — K922 Gastrointestinal hemorrhage, unspecified: Secondary | ICD-10-CM | POA: Diagnosis not present

## 2022-05-15 LAB — TYPE AND SCREEN
ABO/RH(D): O POS
Antibody Screen: NEGATIVE
Unit division: 0

## 2022-05-15 LAB — COMPREHENSIVE METABOLIC PANEL
ALT: 16 U/L (ref 0–44)
AST: 14 U/L — ABNORMAL LOW (ref 15–41)
Albumin: 2.3 g/dL — ABNORMAL LOW (ref 3.5–5.0)
Alkaline Phosphatase: 100 U/L (ref 38–126)
Anion gap: 2 — ABNORMAL LOW (ref 5–15)
BUN: 20 mg/dL (ref 6–20)
CO2: 32 mmol/L (ref 22–32)
Calcium: 8.5 mg/dL — ABNORMAL LOW (ref 8.9–10.3)
Chloride: 109 mmol/L (ref 98–111)
Creatinine, Ser: 0.6 mg/dL (ref 0.44–1.00)
GFR, Estimated: >60 mL/min (ref >60)
Glucose, Bld: 94 mg/dL (ref 70–99)
Potassium: 2.9 mmol/L — ABNORMAL LOW (ref 3.5–5.1)
Sodium: 143 mmol/L (ref 135–145)
Total Bilirubin: 0.4 mg/dL (ref 0.3–1.2)
Total Protein: 4.9 g/dL — ABNORMAL LOW (ref 6.5–8.1)

## 2022-05-15 LAB — CBC
HCT: 25.3 % — ABNORMAL LOW (ref 36.0–46.0)
Hemoglobin: 8.1 g/dL — ABNORMAL LOW (ref 12.0–15.0)
MCH: 32.8 pg (ref 26.0–34.0)
MCHC: 32 g/dL (ref 30.0–36.0)
MCV: 102.4 fL — ABNORMAL HIGH (ref 80.0–100.0)
Platelets: 103 10*3/uL — ABNORMAL LOW (ref 150–400)
RBC: 2.47 MIL/uL — ABNORMAL LOW (ref 3.87–5.11)
RDW: 20.5 % — ABNORMAL HIGH (ref 11.5–15.5)
WBC: 5.6 10*3/uL (ref 4.0–10.5)
nRBC: 0 % (ref 0.0–0.2)

## 2022-05-15 LAB — BPAM RBC
Blood Product Expiration Date: 202306192359
ISSUE DATE / TIME: 202305200849
Unit Type and Rh: 5100

## 2022-05-15 MED ORDER — POTASSIUM CHLORIDE 20 MEQ PO PACK
60.0000 meq | PACK | Freq: Once | ORAL | Status: AC
  Administered 2022-05-15: 60 meq
  Filled 2022-05-15: qty 3

## 2022-05-15 NOTE — Evaluation (Signed)
Clinical/Bedside Swallow Evaluation Patient Details  Name: Katie Young MRN: [id number] Date of Birth: 1976/03/01  Today's Date: 05/15/2022 Time: SLP Start Time (ACUTE ONLY): 1305 SLP Stop Time (ACUTE ONLY): 1320 SLP Time Calculation (min) (ACUTE ONLY): 15 min  Past Medical History:  Past Medical History:  Diagnosis Date   Chronic kidney disease    COPD (chronic obstructive pulmonary disease) (HCC)    Dysphagia    Lower leg fracture    Past Surgical History:  Past Surgical History:  Procedure Laterality Date   ABDOMINAL AORTOGRAM W/LOWER EXTREMITY N/A 12/24/2021   Procedure: ABDOMINAL AORTOGRAM W/LOWER EXTREMITY;  Surgeon: Katie Douglas, MD;  Location: [facility];  Service: Cardiovascular;  Laterality: N/A;   BIOPSY  12/31/2021   Procedure: BIOPSY;  Surgeon: Katie Frame, MD;  Location: [facility];  Service: Gastroenterology;;   COLONOSCOPY WITH PROPOFOL N/A 12/31/2021   Procedure: COLONOSCOPY WITH PROPOFOL;  Surgeon: Katie Frame, MD;  Location: [facility];  Service: Gastroenterology;  Laterality: N/A;  815   ESOPHAGEAL DILATION  12/31/2021   Procedure: ESOPHAGEAL DILATION;  Surgeon: Katie Young, Katie Boom, MD;  Location: [facility];  Service: Gastroenterology;;   ESOPHAGOGASTRODUODENOSCOPY (EGD) WITH PROPOFOL N/A 12/31/2021   Procedure: ESOPHAGOGASTRODUODENOSCOPY (EGD) WITH PROPOFOL;  Surgeon: Katie Frame, MD;  Location: [facility];  Service: Gastroenterology;  Laterality: N/A;   FRACTURE SURGERY     TUBAL LIGATION     HPI:  Patient is a 46 y.o. female with PMH: current tobacco and ETOH abuse, CKD, anxiety, CVA, COPD on oxygen, DM-2, HLD, HTN, GERD. She was admitted on 05/08/22 to [facility] from [facility] with AMS and family reporting hematemesis described as black blood x2 days; EGD 12/2021 showed esophagitis but no varices or ulcers. Head CT on 5/14 and MRI brain on 5/20 both negative for acute pathology. She was sedated and  on ventilator 5/15, on 5/18 gastric tube placed for feeding due to lethargy, extubated on 5/18. She was agitated after extubation and on levophed, somnolent through 5/20 but improved and so SLP swallow eval ordered on 5/21.    Assessment / Plan / Recommendation  Clinical Impression  Patient presents with what appears to be a primary oral dysphagia impacted by missing dentition and poor condition of dentition that is present. Cannot r/o pharyngeal phase dysphagia however swallow initiation appeared timely and no overt s/s aspiration or penetration observed. Voice was low in intensity but clear and without any observed hoarseness s/p 4 day intubation. (5/15-5/18). She tolerated thin liquids via straw sips without any observed difficulties and when chewing graham cracker with peanut butter, she did have mastication delays, some wincing observed when chewing. Patient told SLP "my teeth are all jacked up" and that "they weren't like this before". SLP will follow briefly for diet toleration and ability to upgrade solid textures. SLP Visit Diagnosis: Dysphagia, unspecified (R13.10)    Aspiration Risk  Mild aspiration risk    Diet Recommendation Dysphagia 2 (Fine chop);Thin liquid   Liquid Administration via: Cup;Straw Medication Administration: Whole meds with liquid Supervision: Full supervision/cueing for compensatory strategies;Staff to assist with self feeding Compensations: Small sips/bites;Minimize environmental distractions;Slow rate Postural Changes: Seated upright at 90 degrees    Other  Recommendations Oral Care Recommendations: Oral care BID;Staff/trained caregiver to provide oral care    Recommendations for follow up therapy are one component of a multi-disciplinary discharge planning process, led by the attending physician.  Recommendations may be updated based on patient status, additional functional  criteria and insurance authorization.  Follow up Recommendations No SLP follow up       Assistance Recommended at Discharge None  Functional Status Assessment Patient has had a recent decline in their functional status and demonstrates the ability to make significant improvements in function in a reasonable and predictable amount of time.  Frequency and Duration min 1 x/week  1 week       Prognosis Prognosis for Safe Diet Advancement: Good      Swallow Study   General Date of Onset: 05/15/22 HPI: Patient is a 46 y.o. female with PMH: current tobacco and ETOH abuse, CKD, anxiety, CVA, COPD on oxygen, DM-2, HLD, HTN, GERD. She was admitted on 05/08/22 to [facility] from [facility] with AMS and family reporting hematemesis described as black blood x2 days; EGD 12/2021 showed esophagitis but no varices or ulcers. Head CT on 5/14 and MRI brain on 5/20 both negative for acute pathology. She was sedated and on ventilator 5/15, on 5/18 gastric tube placed for feeding due to lethargy, extubated on 5/18. She was agitated after extubation and on levophed, somnolent through 5/20 but improved and so SLP swallow eval ordered on 5/21. Type of Study: Bedside Swallow Evaluation Previous Swallow Assessment: not with SLP, EGD 12/2021 Diet Prior to this Study: NPO Temperature Spikes Noted: No Respiratory Status: Nasal cannula History of Recent Intubation: Yes Length of Intubations (days): 4 days Date extubated: 05/12/22 Behavior/Cognition: Alert;Cooperative;Distractible;Lethargic/Drowsy Oral Cavity Assessment: Dry Oral Care Completed by SLP: Yes Oral Cavity - Dentition: Poor condition;Missing dentition;Other (Comment) (patient reports having upper dentures but that they are not here in hospital room) Self-Feeding Abilities: Total assist Patient Positioning: Upright in bed Baseline Vocal Quality: Low vocal intensity Volitional Cough: Weak Volitional Swallow: Able to elicit    Oral/Motor/Sensory Function Overall Oral Motor/Sensory Function: Within functional limits   Ice Chips     Thin Liquid  Thin Liquid: Within functional limits Presentation: Straw    Nectar Thick     Honey Thick     Puree Puree: Not tested   Solid     Solid: Impaired Oral Phase Functional Implications: Impaired mastication Other Comments: impaired mastication appears related to missing dentition and poor condition of dentition that is present     Angela Nevin, MA, CCC-SLP Speech Therapy

## 2022-05-15 NOTE — Progress Notes (Signed)
eLink Physician-Brief Progress Note Patient Name: Katie Young DOB: 10/23/1976 MRN: [id number]   Date of Service  05/15/2022  HPI/Events of Note  KUB reviewed.  eICU Interventions  OG tube in good position.        Thomasene Lot Mariette Cowley 05/15/2022, 12:44 AM

## 2022-05-15 NOTE — Progress Notes (Signed)
NAME:  Katie Young, MRN:  [id number], DOB:  [birth year], LOS: 7 ADMISSION DATE:  05/08/2022, CONSULTATION DATE:  05/08/22 REFERRING MD:  Romie Minus, MD, CHIEF COMPLAINT:  GI Bleed   History of Present Illness:  46 year old F with current tobacco/EtOH abuse admitted 5/14 with altered mental status and reported hematemesis described as black blood x 2 days by the family who presented to [facility]. No further episodes of hematemesis on arrival however was profoundly hypotensive to the 50s. She received 3L IVF and started of vasopressors. R IJ central line placed. Last Hgb 10.8, similar to prior labs she had there. K 3.1. BUN/Cr 78/2.49. AST 47 ALT 48 AP 127. PT/INR 11/1.08.   Of note she has had an EGD with [facility] in [place] in 12/2021 that demonstrated esophagitis but no varices or ulcers.    Per her daughter Marcelino Duster, she lives with her boyfriend.  Marcelino Duster had not seen her in several weeks.  Was recently in rehab and discharged to home about 2 months ago.  Unfortunately she started drinking heavily again after her discharge.  Today when Marcelino Duster went to check on her she found her covered in black coffee ground emesis and lethargic.  She was awake enough to refuse transfer to the ED from paramedics at that time.    Pertinent  Medical History  EtOH use, CKD, anxiety, hx stroke, COPD on oxygen, DM2, HLD, HTN, GERD  Significant Hospital Events: Including procedures, antibiotic start and stop dates in addition to other pertinent events   5/14 Admitted to [facility] from [facility] for GI bleed Head CT at OSH: No acute intracranial pathology. Possible sinusitis  5/15  Sedated on vent with no acute complications since admit overnight  5/16 Some issues with bradycardia overnight likely secondary to sedation 5/17 no major issues overnight, alert and interactive on vent this am. Extubated. 5/18 Agitated, kicking staff overnight, asking for beer.  Gastric tube placed for feeding due to  lethargy 5/19 no acute events, did not require ativan and no episodes of agitation 5/20 no acute events, MRI Brain negative for acute pathology, no restraints needed  Interim History / Subjective:   No acute events overnight. More awake this morning No complaints MRI brain negative for acute stroke yesterday  Objective   Blood pressure (!) 122/58, pulse 72, temperature 98.6 F (37 C), temperature source Oral, resp. rate (!) 22, height 5\' 9"  (1.753 m), weight 74.5 kg, last menstrual period 03/09/2014, SpO2 100 %.    FiO2 (%):  [28 %] 28 %   Intake/Output Summary (Last 24 hours) at 05/15/2022 0741 Last data filed at 05/15/2022 0400 Gross per 24 hour  Intake 2339.7 ml  Output --  Net 2339.7 ml   Filed Weights   05/12/22 0434 05/14/22 0500 05/15/22 0500  Weight: 68.3 kg 73.5 kg 74.5 kg    Examination: General: chronically ill appearing, no acute distress, resting in bed HEENT: [place]/AT, moist mucous membranes, sclera anicteric, poor dentition Neuro: A&O x 3, moving all extremities spontaneously CV: rrr, s1s2, no murmurs PULM: diminished breath sounds. No wheezing GI: soft, non-tender, non-distended, BS+ Extremities: warm, no edema Skin: LLE wrapped in dressings  Resolved Hospital Problem list   AKI  Assessment & Plan:   Acute Metabolic Encephalopathy   Hx seizures since childhood Suspect multifactorial in setting of intoxication, sepsis & metabolic encephalopathy. Ammonia negative. MRI brain 5/20 negative for acute pathology - improving mental status - follow serial neuro exam  -seizure precautions  Hematemesis secondary to suspected upper GI bleed ETOH Abuse EGD with [facility] in [place] in 12/2021 that demonstrated esophagitis but no varices or ulcers.  Octreotide stopped 5/16.  -appreciate GI evaluation > rec's for no acute intervention / scope at this time  -monitor for further bleeding (none since admit) -follow H/H trend  -BID PPI  -continue folate, thiamine,  MVI  Acute hypoxemic respiratory failure COPD on home O2 Tobacco abuse Aspiration PNA / Proteus Mirabilis (pan sensitive) Concern for possible overdose/intoxication vs aspiration event in the setting of GI bleed.  -wean O2 for sat >90%, can likely come off supplemental O2 today -unasyn completed 5/19  -fever curve / WBC trend improved    Hypotension  Relative Adrenal Insufficiency  Suspect multifactorial in setting of hypovolemia, component of possible bleeding prior to admit, sedation, relative AI & sepsis. 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 -off levophed -stress dose steroids stopped 5/20  Chronic left lower extremity osteomyelitis Per chart review LLE wound dated back to 2013 after undergoing ORIF to ankle fracture. Patient last saw wound care 10/05/2021 at which time Augmentin was prescribed and dressing orders placed. On admission wound appeared to be infested with maggots.   -appreciate WOC evaluation, local wound care  -keep LE clean/dry  Hypernatremia  Hypokalemia Hypophosphoremia  -follow BMP, electrolytes  -replace electrolytes as indicated  - continue free water via NG tube  Anemia in the setting of acute GI bleed with Iron deficiency  -follow CBC  -transfuse for Hgb <7%  -continue MVI, folate, thiamine   Thrombocytopenia, improving Suspect in setting of ETOH abuse, sepsis  -follow trend, no indication for transfusion   Severe Protein Calorie Malnutrition  Albumin 1.7 -continue TF support per Nutrition  -protein supplementation -monitor I/O's  - speech eval today to evaluate for PO intake  Generalized weakness/deconditioning - PT/OT consults  Disposition: Patient safe to transfer out of ICU today  Best Practice (right click and "Reselect all SmartList Selections" daily)  Diet/type: tubefeeds and NPO DVT prophylaxis: other contraindicated with hematemesis  GI prophylaxis: PPI Lines: Central line and No longer needed.   Order written to d/c  Foley:  N/A Code Status:  full code Last date of multidisciplinary goals of care discussion: Daughter updated 5/19 by Canary Brim  Critical care time: n/a    Melody Comas, MD [facility] Office: [phone]   See Amion for personal pager PCCM on call pager [phone] until 7pm. Please call Elink 7p-7a. [phone]

## 2022-05-16 ENCOUNTER — Encounter (HOSPITAL_COMMUNITY): Payer: Self-pay | Admitting: Pulmonary Disease

## 2022-05-16 ENCOUNTER — Other Ambulatory Visit: Payer: Self-pay

## 2022-05-16 DIAGNOSIS — E440 Moderate protein-calorie malnutrition: Secondary | ICD-10-CM

## 2022-05-16 DIAGNOSIS — D62 Acute posthemorrhagic anemia: Secondary | ICD-10-CM

## 2022-05-16 DIAGNOSIS — E870 Hyperosmolality and hypernatremia: Secondary | ICD-10-CM

## 2022-05-16 DIAGNOSIS — J690 Pneumonitis due to inhalation of food and vomit: Secondary | ICD-10-CM

## 2022-05-16 DIAGNOSIS — G9341 Metabolic encephalopathy: Secondary | ICD-10-CM

## 2022-05-16 DIAGNOSIS — J9601 Acute respiratory failure with hypoxia: Secondary | ICD-10-CM

## 2022-05-16 DIAGNOSIS — E876 Hypokalemia: Secondary | ICD-10-CM

## 2022-05-16 DIAGNOSIS — K922 Gastrointestinal hemorrhage, unspecified: Secondary | ICD-10-CM | POA: Diagnosis not present

## 2022-05-16 LAB — GLUCOSE, CAPILLARY
Glucose-Capillary: 100 mg/dL — ABNORMAL HIGH (ref 70–99)
Glucose-Capillary: 105 mg/dL — ABNORMAL HIGH (ref 70–99)
Glucose-Capillary: 109 mg/dL — ABNORMAL HIGH (ref 70–99)
Glucose-Capillary: 114 mg/dL — ABNORMAL HIGH (ref 70–99)
Glucose-Capillary: 116 mg/dL — ABNORMAL HIGH (ref 70–99)
Glucose-Capillary: 119 mg/dL — ABNORMAL HIGH (ref 70–99)
Glucose-Capillary: 120 mg/dL — ABNORMAL HIGH (ref 70–99)
Glucose-Capillary: 124 mg/dL — ABNORMAL HIGH (ref 70–99)
Glucose-Capillary: 128 mg/dL — ABNORMAL HIGH (ref 70–99)
Glucose-Capillary: 136 mg/dL — ABNORMAL HIGH (ref 70–99)
Glucose-Capillary: 142 mg/dL — ABNORMAL HIGH (ref 70–99)
Glucose-Capillary: 163 mg/dL — ABNORMAL HIGH (ref 70–99)
Glucose-Capillary: 90 mg/dL (ref 70–99)
Glucose-Capillary: 91 mg/dL (ref 70–99)
Glucose-Capillary: 94 mg/dL (ref 70–99)
Glucose-Capillary: 96 mg/dL (ref 70–99)
Glucose-Capillary: 99 mg/dL (ref 70–99)
Glucose-Capillary: 90 mg/dL (ref 70–99)
Glucose-Capillary: 73 mg/dL (ref 70–99)
Glucose-Capillary: 93 mg/dL (ref 70–99)

## 2022-05-16 LAB — CBC
HCT: 30.4 % — ABNORMAL LOW (ref 36.0–46.0)
Hemoglobin: 9.5 g/dL — ABNORMAL LOW (ref 12.0–15.0)
MCH: 31.9 pg (ref 26.0–34.0)
MCHC: 31.3 g/dL (ref 30.0–36.0)
MCV: 102 fL — ABNORMAL HIGH (ref 80.0–100.0)
Platelets: 135 10*3/uL — ABNORMAL LOW (ref 150–400)
RBC: 2.98 MIL/uL — ABNORMAL LOW (ref 3.87–5.11)
RDW: 19.6 % — ABNORMAL HIGH (ref 11.5–15.5)
WBC: 6.9 10*3/uL (ref 4.0–10.5)
nRBC: 0 % (ref 0.0–0.2)

## 2022-05-16 LAB — BASIC METABOLIC PANEL
Anion gap: 5 (ref 5–15)
BUN: 16 mg/dL (ref 6–20)
CO2: 31 mmol/L (ref 22–32)
Calcium: 8.9 mg/dL (ref 8.9–10.3)
Chloride: 107 mmol/L (ref 98–111)
Creatinine, Ser: 0.54 mg/dL (ref 0.44–1.00)
GFR, Estimated: >60 mL/min (ref >60)
Glucose, Bld: 97 mg/dL (ref 70–99)
Potassium: 3.2 mmol/L — ABNORMAL LOW (ref 3.5–5.1)
Sodium: 143 mmol/L (ref 135–145)

## 2022-05-16 LAB — CBG MONITORING, ED: Glucose-Capillary: 81 mg/dL (ref 70–99)

## 2022-05-16 LAB — MAGNESIUM: Magnesium: 1.7 mg/dL (ref 1.7–2.4)

## 2022-05-16 MED ORDER — POTASSIUM CHLORIDE 20 MEQ PO PACK: 40.0000 meq | PACK | Freq: Two times a day (BID) | ORAL | Status: DC

## 2022-05-16 MED ORDER — ENSURE ENLIVE PO LIQD
237.0000 mL | Freq: Two times a day (BID) | ORAL | Status: DC
  Administered 2022-05-16 – 2022-05-17 (×2): 237 mL via ORAL

## 2022-05-16 MED ORDER — ADULT MULTIVITAMIN W/MINERALS CH
1.0000 | ORAL_TABLET | Freq: Every day | ORAL | Status: DC
  Administered 2022-05-17 – 2022-05-18 (×2): 1 via ORAL
  Filled 2022-05-16 (×2): qty 1

## 2022-05-16 MED ORDER — POTASSIUM CHLORIDE 20 MEQ PO PACK
40.0000 meq | PACK | Freq: Three times a day (TID) | ORAL | Status: DC
  Administered 2022-05-16: 40 meq via ORAL
  Filled 2022-05-16: qty 2

## 2022-05-16 MED ORDER — JUVEN PO PACK
1.0000 | PACK | Freq: Two times a day (BID) | ORAL | Status: DC
  Administered 2022-05-16 – 2022-05-18 (×5): 1 via ORAL
  Filled 2022-05-16 (×2): qty 1

## 2022-05-16 MED ORDER — POTASSIUM CHLORIDE 20 MEQ PO PACK
40.0000 meq | PACK | Freq: Two times a day (BID) | ORAL | Status: AC
  Administered 2022-05-16 – 2022-05-17 (×2): 40 meq via ORAL
  Filled 2022-05-16 (×2): qty 2

## 2022-05-16 MED ORDER — PANTOPRAZOLE SODIUM 40 MG PO TBEC
40.0000 mg | DELAYED_RELEASE_TABLET | Freq: Two times a day (BID) | ORAL | Status: DC
Start: 2022-05-16 — End: 2022-05-19
  Administered 2022-05-16 – 2022-05-18 (×5): 40 mg via ORAL
  Filled 2022-05-16 (×5): qty 1

## 2022-05-16 MED ORDER — ASCORBIC ACID 500 MG PO TABS
500.0000 mg | ORAL_TABLET | Freq: Two times a day (BID) | ORAL | Status: DC
Start: 2022-05-16 — End: 2022-05-19
  Administered 2022-05-16 – 2022-05-17 (×3): 500 mg via ORAL
  Filled 2022-05-16 (×4): qty 1

## 2022-05-16 MED ORDER — MAGNESIUM OXIDE -MG SUPPLEMENT 400 (240 MG) MG PO TABS
400.0000 mg | ORAL_TABLET | Freq: Two times a day (BID) | ORAL | Status: AC
  Administered 2022-05-16 (×2): 400 mg via ORAL
  Filled 2022-05-16 (×2): qty 1

## 2022-05-16 NOTE — Evaluation (Signed)
Physical Therapy Evaluation Patient Details Name: Katie BruinsShannon S Werth MRN: [id number] DOB: 08/27/1976 Today's Date: 05/16/2022  History of Present Illness  Katie Young is an 46 y.o. female past medical history of alcohol abuse admitted on 05/08/2022 for acute encephalopathy, hematemesis,and black tarry stools,was found profoundly hypotensive .  Clinical Impression  The  patient was able to participate in mobility, min assistance for bed mobility, mod +2 for standing and step to recliner. Patient' s knees   flexed and head  flexed forward. Patient is noted to favor  right head rotation burt is able to rotate to the  left with cues.   Patient  will benefit from  SNF at DC  to return to functional independence  BP 120/61 supine,  116/67 after mobility.  HR in 90's SPO2 on 3 L >90%. Patient is noted with 3/4 dyspnea with mobility. Pt admitted with above diagnosis.  Pt currently with functional limitations due to the deficits listed below (see PT Problem List). Pt will benefit from skilled PT to increase their independence and safety with mobility to allow discharge to the venue listed below.        Recommendations for follow up therapy are one component of a multi-disciplinary discharge planning process, led by the attending physician.  Recommendations may be updated based on patient status, additional functional criteria and insurance authorization.  Follow Up Recommendations Skilled nursing-short term rehab (<3 hours/day)    Assistance Recommended at Discharge Frequent or constant Supervision/Assistance  Patient can return home with the following  A lot of help with walking and/or transfers;A lot of help with bathing/dressing/bathroom;Assistance with cooking/housework;Assist for transportation;Help with stairs or ramp for entrance    Equipment Recommendations None recommended by PT  Recommendations for Other Services       Functional Status Assessment Patient has had a recent decline in their  functional status and demonstrates the ability to make significant improvements in function in a reasonable and predictable amount of time.     Precautions / Restrictions Precautions Precautions: Fall   Monitor VS.     Mobility  Bed Mobility Overal bed mobility: Needs Assistance Bed Mobility: Supine to Sit     Supine to sit: Min guard, HOB elevated     General bed mobility comments: use of rail, extra time and cues for safety    Transfers Overall transfer level: Needs assistance Equipment used: Rolling walker (2 wheels) Transfers: Sit to/from Stand, Bed to chair/wheelchair/BSC Sit to Stand: Mod assist, +2 physical assistance, +2 safety/equipment, From elevated surface   Step pivot transfers: Mod assist, +2 physical assistance, +2 safety/equipment       General transfer comment: patient stands with knees flexed, head flexed forward, multimodal cues to stand more erect. Patient  shuufled to recliner using Rw, required assistance to move the RE. Decreased control of descent    Ambulation/Gait                  Stairs            Wheelchair Mobility    Modified Rankin (Stroke Patients Only)       Balance Overall balance assessment: Needs assistance Sitting-balance support: Feet supported, Bilateral upper extremity supported Sitting balance-Leahy Scale: Poor     Standing balance support: Bilateral upper extremity supported, Reliant on assistive device for balance, During functional activity Standing balance-Leahy Scale: Poor Standing balance comment: support for balance and trunk control  Pertinent Vitals/Pain Pain Assessment Pain Assessment: No/denies pain    Home Living Family/patient expects to be discharged to:: Private residence Living Arrangements: Other (Comment) Available Help at Discharge: Family;Available PRN/intermittently Type of Home: Apartment Home Access: Stairs to enter   Entrance  Stairs-Number of Steps: 2 STE   Home Layout: One level   Additional Comments: patient reported concerns over being able to go back to current place of living reporting she has been gone for over a month?    Prior Function Prior Level of Function : Independent/Modified Independent             Mobility Comments: patient reported using the RW evey now and then when needed. has a rollator at home as well.       Hand Dominance   Dominant Hand: Right    Extremity/Trunk Assessment   Upper Extremity Assessment Upper Extremity Assessment: Defer to OT evaluation    Lower Extremity Assessment Lower Extremity Assessment: Generalized weakness    Cervical / Trunk Assessment Cervical / Trunk Assessment: Other exceptions Cervical / Trunk Exceptions: tends  to keep head rotated to right, head forward posture when sitting and standing.  Communication   Communication: No difficulties (speach is low ansd somewhat slurred at times)  Cognition Arousal/Alertness: Awake/alert Behavior During Therapy: WFL for tasks assessed/performed Overall Cognitive Status: Difficult to assess                                 General Comments: noted to have soft voice with some mumbling at times.        General Comments      Exercises     Assessment/Plan    PT Assessment Patient needs continued PT services  PT Problem List Decreased strength;Decreased balance;Decreased cognition;Decreased knowledge of precautions;Decreased mobility;Decreased knowledge of use of DME;Decreased activity tolerance;Decreased safety awareness       PT Treatment Interventions DME instruction;Therapeutic activities;Cognitive remediation;Gait training;Therapeutic exercise;Patient/family education;Functional mobility training    PT Goals (Current goals can be found in the Care Plan section)  Acute Rehab PT Goals Patient Stated Goal: agree for OOB PT Goal Formulation: With patient Time For Goal Achievement:  05/30/22 Potential to Achieve Goals: Fair    Frequency Min 2X/week     Co-evaluation PT/OT/SLP Co-Evaluation/Treatment: Yes Reason for Co-Treatment: To address functional/ADL transfers;Necessary to address cognition/behavior during functional activity PT goals addressed during session: Mobility/safety with mobility OT goals addressed during session: ADL's and self-care       AM-PAC PT "6 Clicks" Mobility  Outcome Measure Help needed turning from your back to your side while in a flat bed without using bedrails?: A Little Help needed moving from lying on your back to sitting on the side of a flat bed without using bedrails?: A Little Help needed moving to and from a bed to a chair (including a wheelchair)?: A Lot Help needed standing up from a chair using your arms (e.g., wheelchair or bedside chair)?: A Lot Help needed to walk in hospital room?: Total Help needed climbing 3-5 steps with a railing? : Total 6 Click Score: 12    End of Session Equipment Utilized During Treatment: Gait belt Activity Tolerance: Patient limited by fatigue Patient left: in chair;with call bell/phone within reach;with chair alarm set Nurse Communication: Mobility status PT Visit Diagnosis: Unsteadiness on feet (R26.81);Muscle weakness (generalized) (M62.81);Difficulty in walking, not elsewhere classified (R26.2)    Time: 5009-3818 PT Time Calculation (min) (ACUTE ONLY): 16  min   Charges:   PT Evaluation $PT Eval Low Complexity: 1 Low          Blanchard Kelch PT Acute Rehabilitation Services Pager [phone] Office [phone]   Rada Hay 05/16/2022, 12:06 PM

## 2022-05-16 NOTE — Progress Notes (Signed)
Speech Language Pathology Treatment: Dysphagia  Patient Details Name: Katie Young MRN: [id number] DOB: 1976/03/12 Today's Date: 05/16/2022 Time: 8250-0370 SLP Time Calculation (min) (ACUTE ONLY): 20 min  Assessment / Plan / Recommendation Clinical Impression  Patient seen by SLP for skilled treatment focused on dysphagia goals. Per RN, patient ate full breakfast meal tray and no overt difficulties. When SLP entered room, patient awake, alert, asking SLP "is that man still here?" And when asked to clarify she said "Katie Young". She indicated that she and Katie live in the same house and have been dating on and off for past 10 years. Patient has not had any visitors this morning. She told SLP that although she does have dentures "at my daughter's house or at my Mom's house, I dont know where theyre at", they are loose and do not fit properly. Patient consumed straw sips of thin liquids (soda) with suspected mild cognitive based swallow initiation delay but without overt s/s aspiration or penetration. Towards end of session, patient had a productive cough and ended up spitting out a very small piece of what looked like scrambled eggs. Unsure if this was in mouth or she actually cleared it from pharynx. SLP recommending to continue with Dys 2 solids, thin liquids and continued f/u for readiness to advance PO textures.   HPI HPI: Patient is a 46 y.o. female with PMH: current tobacco and ETOH abuse, CKD, anxiety, CVA, COPD on oxygen, DM-2, HLD, HTN, GERD. She was admitted on 05/08/22 to [facility] from [facility] with AMS and family reporting hematemesis described as black blood x2 days; EGD 12/2021 showed esophagitis but no varices or ulcers. Head CT on 5/14 and MRI brain on 5/20 both negative for acute pathology. She was sedated and on ventilator 5/15, on 5/18 gastric tube placed for feeding due to lethargy, extubated on 5/18. She was agitated after extubation and on levophed, somnolent through 5/20 but  improved and so SLP swallow eval ordered on 5/21.      SLP Plan  Continue with current plan of care      Recommendations for follow up therapy are one component of a multi-disciplinary discharge planning process, led by the attending physician.  Recommendations may be updated based on patient status, additional functional criteria and insurance authorization.    Recommendations  Diet recommendations: Thin liquid Liquids provided via: Cup;Straw Medication Administration: Whole meds with liquid Supervision: Patient able to self feed;Full supervision/cueing for compensatory strategies Compensations: Small sips/bites;Minimize environmental distractions;Slow rate Postural Changes and/or Swallow Maneuvers: Seated upright 90 degrees                Oral Care Recommendations: Oral care BID;Staff/trained caregiver to provide oral care Follow Up Recommendations: Other (comment) (TBD) Assistance recommended at discharge: None SLP Visit Diagnosis: Dysphagia, unspecified (R13.10) Plan: Continue with current plan of care          Katie Nevin, MA, CCC-SLP Speech Therapy

## 2022-05-16 NOTE — Progress Notes (Signed)
Nutrition Follow-up  DOCUMENTATION CODES:   Non-severe (moderate) malnutrition in context of social or environmental circumstances  INTERVENTION:   -48 hour Calorie Count  -Ensure Plus High Protein po BID, each supplement provides 350 kcal and 20 grams of protein.   -1 packet Juven BID, each packet provides 95 calories, 2.5 grams of protein (collagen), and 9.8 grams of carbohydrate (3 grams sugar); also contains 7 grams of L-arginine and L-glutamine, 300 mg vitamin C, 15 mg vitamin E, 1.2 mcg vitamin B-12, 9.5 mg zinc, 200 mg calcium, and 1.5 g  Calcium Beta-hydroxy-Beta-methylbutyrate to support wound healing   -Multivitamin with minerals daily -500 mg Vitamin C BID  -Recommend supplementation of Vitamin D (labs low on 5/16)   NUTRITION DIAGNOSIS:   Moderate Malnutrition related to social / environmental circumstances (self-neglect, EtOH abuse) as evidenced by mild fat depletion, moderate muscle depletion.  Ongoing.  GOAL:   Patient will meet greater than or equal to 90% of their needs  Progressing.  MONITOR:   Labs, Weight trends, I & O's, Skin, PO intake, Supplement acceptance  REASON FOR ASSESSMENT:   Consult Assessment of nutrition requirement/status, Calorie Count  ASSESSMENT:   46 year old female with current tobacco/EtOH abuse who presents with altered mental status and reported hematemesis described as black blood x 2 days by the family who presented to [facility]. Admitted for upper GI Bleed.  Patient's tube feeding stopped per MD. Was receiving Vital AF 1.2 @ goal of 60. TF was off 5/21 d/t tube needing to be repositioned. Tube was removed this morning.  Switched supplements to PO. SLP has approved pt to have dysphagia 2 diet with thin liquids.   Ensure supplements have been ordered. Pt declined first dose d/t feeling full.   Calorie Count ordered per consult.  Results available 5/23.   Admission weight: 142 lbs. Current weight: 162 lbs.  Per  nursing documentation, pt with mild BLE edema.   Medications: Vitamin C, Folic acid, MAG-OX, KLOR-CON, Thiamine  Labs reviewed:  CBGs: 81-124 Low K  Diet Order:   Diet Order             DIET DYS 2 Room service appropriate? Yes; Fluid consistency: Thin  Diet effective now                   EDUCATION NEEDS:   Not appropriate for education at this time  Skin:  Skin Assessment: Skin Integrity Issues: Skin Integrity Issues:: Unstageable, Other (Comment) Unstageable: right, left, buttocks Other: Non-pressure wound to LLE  Last BM:  5/17  Height:   Ht Readings from Last 1 Encounters:  05/08/22 5\' 9"  (1.753 m)    Weight:   Wt Readings from Last 1 Encounters:  05/16/22 73.6 kg    BMI:  Body mass index is 23.96 kg/m.  Estimated Nutritional Needs:   Kcal:  1800-2000  Protein:  100-115g  Fluid:  2L/day   05/18/22, MS, RD, LDN Inpatient Clinical Dietitian Contact information available via Amion

## 2022-05-16 NOTE — Progress Notes (Signed)
[facility] PROGRESS NOTE    Progress Note  Katie Young  ZOX:[id number] DOB: 1976/02/27 DOA: 05/08/2022 PCP: Waldon Reining, MD     Brief Narrative:   Katie Young is an 46 y.o. female past medical history of alcohol abuse admitted on 05/08/2022 for acute encephalopathy hematemesis and black tarry stools for 2 days presented to [facility] was found profoundly hypotensive was fluid resuscitated started on pressors.  According to the daughter she started drinking heavily again when her daughter went to the home she found her covered in black tarry stools and emesis lethargic.   Assessment/Plan:   Hematemesis secondary to acute upper GI bleed/acute blood loss anemia: She had an EGD at [facility] on January 2023 that showed esophagitis no varices.   On admission She was started on octreotide which was DC'd on 05/10/2022. GI was consulted recommended no further evaluation or scoping at this time as her bleeding has subsided. Continue oral PPI thiamine and folate.  Acute metabolic encephalopathy: She has a history of seizures since childhood. Suspect multifactorial in the setting of intoxication sepsis. MRI on 05/14/2022 showed no acute findings. Mental status is improved now.  Acute respiratory failure with hypoxia/COPD on home oxygen/aspiration pneumonia probably due to Proteus Mirabella's: She is status post extubation 05/11/2022. She definitely aspirated in the setting of intoxication and GI bleed. Her Proteus was pansensitive. She was started on IV Unasyn and completed her treatment on 05/13/2022.  Hypotension/relative adrenal insufficiency: In the setting of hypovolemia GI bleed sedation and sepsis. Of infection with maggots of the left lower extremity. She is now off Levophed She completed a stress dose steroid which were stopped on 05/14/2022.  Chronic left lower extremity infection: Last seen at the wound care clinic on 10/05/2021 she was started on  Augmentin unsure if she finished her treatment. Wound care was consulted, who related appears to be infected with maggots, which apparently the majority were flushed out by the wound care nurse. Who recommended daily skin hygiene measures with sterile normal saline and cover with daily gauze.  Electrolyte imbalance: Hyponatremia, hypokalemia and hypophosphatemia: Repleted orally and with free water flushes per tube. Her potassium is low this morning replete orally recheck in the morning.  Anemia in the setting of GI bleed and iron deficiency: Status post 2 units of packed red blood cells her hemoglobin this morning is 8.1.  Thrombocytopenia: Probably chronic in setting of alcohol abuse.  Severe protein caloric malnutrition: With an albumin of 1.7 was on enteral nutrition per tube. She relates this morning she is hungry she would like to try some food. She tried her food yesterday ate about 25 to 50%. Discontinue NG tube and tube feedings. Get nutrition consult to assess caloric intake. Monitor strict I's and O's. Speech evaluated the patient recommended dysphagia 2 diet.  Estimated body mass index is 23.96 kg/m as calculated from the following:   Height as of this encounter:  (1.753 m).   Weight as of this encounter: 73.6 kg. Malnutrition Type:  Nutrition Problem: Moderate Malnutrition Etiology: social / environmental circumstances (self-neglect, EtOH abuse)   Malnutrition Characteristics:  Signs/Symptoms: mild fat depletion, moderate muscle depletion   Nutrition Interventions:  Interventions: Tube feeding, Prostat, MVI    DVT prophylaxis: lovenox Family Communication:daughter Status is: Inpatient Remains inpatient appropriate because: Sepsis and GI bleed    Code Status:     Code Status Orders  (From admission, onward)           Start  Ordered   05/08/22 1948  Full code  Continuous        05/08/22 1955           Code Status History      Date Active Date Inactive Code Status Order ID Comments User Context   12/24/2021 1513 12/24/2021 2236 Full Code [id number]  Leonie DouglasHawken, Thomas N, MD Inpatient         IV Access:   Peripheral IV   Procedures and diagnostic studies:   DG Abd 1 View  Result Date: 05/15/2022 CLINICAL DATA:  NG placement. EXAM: ABDOMEN - 1 VIEW COMPARISON:  Earlier radiograph dated 05/14/2022. FINDINGS: Interval advancement of the enteric tube with side-port chest distal to the GE junction and tip in the proximal stomach. No other interval change. IMPRESSION: Enteric tube with tip in the proximal stomach. Electronically Signed   By: Elgie CollardArash  Radparvar M.D.   On: 05/15/2022 00:06   DG Abd 1 View  Result Date: 05/14/2022 CLINICAL DATA:  Tube placement. EXAM: ABDOMEN - 1 VIEW COMPARISON:  Radiograph 05/12/2022 FINDINGS: Tip of the enteric tube is below the diaphragm in the stomach, the side port is in the region of the gastroesophageal junction. Air-filled stomach without significant gaseous gastric distension. There is bibasilar airspace disease, increased on the left from prior abdominal radiograph. IMPRESSION: Tip of the enteric tube below the diaphragm in the stomach, side-port in the region of the gastroesophageal junction. Recommend advancement of approximately 5 centimeters for optimal placement. Electronically Signed   By: Narda RutherfordMelanie  Sanford M.D.   On: 05/14/2022 22:17   MR BRAIN WO CONTRAST  Result Date: 05/14/2022 CLINICAL DATA:  Neuro deficit, acute, stroke suspected Mental status change, unknown cause EXAM: MRI HEAD WITHOUT CONTRAST TECHNIQUE: Multiplanar, multiecho pulse sequences of the brain and surrounding structures were obtained without intravenous contrast. COMPARISON:  None Available. FINDINGS: Motion artifact is present. Brain: There is no acute infarction or intracranial hemorrhage. There is no intracranial mass, mass effect, or edema. There is no hydrocephalus or extra-axial fluid collection. No  abnormal enhancement. Vascular: Major vessel flow voids at the skull base are preserved. Skull and upper cervical spine: Normal marrow signal is preserved. Sinuses/Orbits: Paranasal sinus mucosal thickening with greatest involvement of the right maxillary sinus and left sphenoid sinus. Orbits are unremarkable. Other: Sella is unremarkable. Right greater than left mastoid effusions. IMPRESSION: No evidence of recent infarction, hemorrhage, or mass. Electronically Signed   By: Guadlupe SpanishPraneil  Patel M.D.   On: 05/14/2022 13:34     Medical Consultants:   None.   Subjective:    Katie Young relates she feels better she is hungry she would like some fluid.   Objective:    Vitals:   05/16/22 0500 05/16/22 0600 05/16/22 0615 05/16/22 0630  BP:  135/72    Pulse:  77 77 77  Resp:  18 19 18   Temp:    98.1 F (36.7 C)  TempSrc:    Oral  SpO2:  95% 96% 96%  Weight: 73.6 kg     Height:       SpO2: 96 % O2 Flow Rate (L/min): 2 L/min FiO2 (%): 28 %   Intake/Output Summary (Last 24 hours) at 05/16/2022 0656 Last data filed at 05/16/2022 0600 Gross per 24 hour  Intake 2110 ml  Output 1400 ml  Net 710 ml   Filed Weights   05/14/22 0500 05/15/22 0500 05/16/22 0500  Weight: 73.5 kg 74.5 kg 73.6 kg    Exam: General exam: In no acute  distress. Respiratory system: Good air movement and clear to auscultation. Cardiovascular system: S1 & S2 heard, RRR. No JVD. Gastrointestinal system: Abdomen is nondistended, soft and nontender.  Central nervous system: Alert and oriented. No focal neurological deficits. Extremities: No pedal edema. Skin: Left lower extremity is wrapped. Psychiatry: Judgement and insight appear normal. Mood & affect appropriate.    Data Reviewed:    Labs: Basic Metabolic Panel: Recent Labs  Lab 05/11/22 0535 05/12/22 0440 05/13/22 0405 05/13/22 1058 05/13/22 1647 05/14/22 0433 05/14/22 1714 05/15/22 0854  NA 149* 149* 142  --   --  148*  --  143  K 3.5 3.6  3.7  --   --  3.5  --  2.9*  CL 116* 113* 109  --   --  113*  --  109  CO2 27 30 30   --   --  32  --  32  GLUCOSE 124* 104* 125*  --   --  140*  --  94  BUN 16 7 12   --   --  21*  --  20  CREATININE 0.65 0.58 0.62  --   --  0.62  --  0.60  CALCIUM 7.9* 8.1* 8.0*  --   --  8.9  --  8.5*  MG 2.0  --   --  1.8 1.8 2.0 2.0  --   PHOS 3.7  --   --  3.8 3.3 3.0 2.5  --    GFR Estimated Creatinine Clearance: 90.9 mL/min (by C-G formula based on SCr of 0.6 mg/dL). Liver Function Tests: Recent Labs  Lab 05/12/22 0440 05/13/22 0405 05/14/22 0433 05/15/22 0854  AST 23 21 14* 14*  ALT 25 24 19 16   ALKPHOS 122 133* 100 100  BILITOT 0.7 0.6 0.5 0.4  PROT 5.0* 5.1* 5.6* 4.9*  ALBUMIN 1.7* 1.7* 2.8* 2.3*   No results for input(s): LIPASE, AMYLASE in the last 168 hours. Recent Labs  Lab 05/12/22 0850  AMMONIA 13   Coagulation profile No results for input(s): INR, PROTIME in the last 168 hours. COVID-19 Labs  No results for input(s): DDIMER, FERRITIN, LDH, CRP in the last 72 hours.  No results found for: SARSCOV2NAA  CBC: Recent Labs  Lab 05/12/22 0440 05/13/22 0405 05/14/22 0433 05/14/22 0551 05/14/22 1404 05/15/22 0854  WBC 5.7 5.2 3.6* 3.9*  --  5.6  HGB 7.2* 7.4* 6.4* 6.5* 8.0* 8.1*  HCT 24.1* 23.7* 20.9* 21.2* 24.8* 25.3*  MCV 108.1* 107.2* 109.4* 109.8*  --  102.4*  PLT 98* 93* 72* 71*  --  103*   Cardiac Enzymes: No results for input(s): CKTOTAL, CKMB, CKMBINDEX, TROPONINI in the last 168 hours. BNP (last 3 results) No results for input(s): PROBNP in the last 8760 hours. CBG: Recent Labs  Lab 05/12/22 2005 05/12/22 2342 05/13/22 0321 05/13/22 0737 05/13/22 1148  GLUCAP 147* 138* 126* 121* 131*   D-Dimer: No results for input(s): DDIMER in the last 72 hours. Hgb A1c: No results for input(s): HGBA1C in the last 72 hours. Lipid Profile: No results for input(s): CHOL, HDL, LDLCALC, TRIG, CHOLHDL, LDLDIRECT in the last 72 hours. Thyroid function studies: No  results for input(s): TSH, T4TOTAL, T3FREE, THYROIDAB in the last 72 hours.  Invalid input(s): FREET3 Anemia work up: No results for input(s): VITAMINB12, FOLATE, FERRITIN, TIBC, IRON, RETICCTPCT in the last 72 hours. Sepsis Labs: Recent Labs  Lab 05/13/22 0405 05/14/22 0433 05/14/22 0551 05/15/22 0854  WBC 5.2 3.6* 3.9* 5.6   Microbiology Recent  Results (from the past 240 hour(s))  MRSA Next Gen by PCR, Nasal     Status: None   Collection Time: 05/08/22  7:27 PM   Specimen: Nasal Mucosa; Nasal Swab  Result Value Ref Range Status   MRSA by PCR Next Gen NOT DETECTED NOT DETECTED Final    Comment: (NOTE) The GeneXpert MRSA Assay (FDA approved for NASAL specimens only), is one component of a comprehensive MRSA colonization surveillance program. It is not intended to diagnose MRSA infection nor to guide or monitor treatment for MRSA infections. Test performance is not FDA approved in patients less than 38 years old. Performed at [facility], [address] [place], [place] [address]   Culture, blood (Routine X 2) w Reflex to ID Panel     Status: None   Collection Time: 05/08/22  8:21 PM   Specimen: BLOOD LEFT ARM  Result Value Ref Range Status   Specimen Description   Final    BLOOD LEFT ARM Performed at [facility], [address], [place], [place] [address]    Special Requests   Final    BOTTLES DRAWN AEROBIC ONLY Blood Culture adequate volume Performed at [facility], [address] [place], [place] [address]    Culture   Final    NO GROWTH 5 DAYS Performed at [facility], [address], [place], [place] [address]    Report Status 05/13/2022 FINAL  Final  Culture, blood (Routine X 2) w Reflex to ID Panel     Status: None   Collection Time: 05/08/22  8:21 PM   Specimen: BLOOD  Result Value Ref Range Status   Specimen Description   Final    BLOOD LEFT ANTECUBITAL Performed at [facility],  [address], [place], [place] [address]    Special Requests   Final    BOTTLES DRAWN AEROBIC ONLY Blood Culture adequate volume Performed at [facility], [address], [place], [place] [address]    Culture   Final    NO GROWTH 5 DAYS Performed at [facility], [address] [place], [place] [address]    Report Status 05/13/2022 FINAL  Final  Culture, Respiratory w Gram Stain (tracheal aspirate)     Status: None   Collection Time: 05/09/22  3:00 AM   Specimen: Tracheal Aspirate; Respiratory  Result Value Ref Range Status   Specimen Description   Final    TRACHEAL ASPIRATE Performed at [facility], [address] [place], [place] [address]    Special Requests   Final    NONE Performed at [facility], [address], [place], [place] [address]    Gram Stain   Final    FEW WBC PRESENT, PREDOMINANTLY PMN RARE GRAM POSITIVE COCCI IN TETRADS RARE BUDDING YEAST SEEN    Culture   Final    RARE PROTEUS MIRABILIS WITH IN MIXED ORGANISMS Performed at [facility], [address] [place], [place] [address]    Report Status 05/11/2022 FINAL  Final   Organism ID, Bacteria PROTEUS MIRABILIS  Final      Susceptibility   Proteus mirabilis - MIC*    AMPICILLIN <=2 SENSITIVE Sensitive     CEFAZOLIN 8 SENSITIVE Sensitive     CEFEPIME 0.25 SENSITIVE Sensitive     CEFTAZIDIME <=1 SENSITIVE Sensitive     CEFTRIAXONE <=0.25 SENSITIVE Sensitive     CIPROFLOXACIN <=0.25 SENSITIVE Sensitive  GENTAMICIN <=1 SENSITIVE Sensitive     IMIPENEM 4 SENSITIVE Sensitive     TRIMETH/SULFA <=20 SENSITIVE Sensitive     AMPICILLIN/SULBACTAM <=2 SENSITIVE Sensitive     PIP/TAZO <=4 SENSITIVE Sensitive     * RARE PROTEUS MIRABILIS     Medications:    arformoterol  15 mcg Nebulization BID   vitamin C  500 mg Per Tube BID   budesonide (PULMICORT) nebulizer solution  0.5 mg Nebulization BID   chlorhexidine  15 mL Mouth  Rinse BID   Chlorhexidine Gluconate Cloth  6 each Topical Daily   feeding supplement (PROSource TF)  45 mL Per Tube BID   folic acid  1 mg Per Tube Daily   free water  200 mL Per Tube Q6H   insulin aspart  0-15 Units Subcutaneous Q4H   mouth rinse  15 mL Mouth Rinse q12n4p   multivitamin with minerals  1 tablet Per Tube Daily   nutrition supplement (JUVEN)  1 packet Per Tube BID BM   pantoprazole (PROTONIX) IV  40 mg Intravenous Q12H   revefenacin  175 mcg Nebulization Daily   thiamine  100 mg Per Tube Daily   Continuous Infusions:  feeding supplement (VITAL AF 1.2 CAL) 1,000 mL (05/16/22 0048)      LOS: 8 days   Marinda Elk  [facility]  05/16/2022, 6:56 AM

## 2022-05-16 NOTE — Evaluation (Signed)
Occupational Therapy Evaluation Patient Details Name: Katie Young MRN: [id number] DOB: Nov 21, 1976 Today's Date: 05/16/2022   History of Present Illness Katie Young is an 46 y.o. female past medical history of alcohol abuse admitted on 05/08/2022 for acute encephalopathy, hematemesis,and black tarry stools,was found profoundly hypotensive .   Clinical Impression   Patient is a 46 year old female who was admitted for above. Patient reported living at home with boyfriend who is there "sometimes" using a RW when needed. Patient was noted to have decreased functional activity tolerance, decreased endurance, decreased sitting and standing balance decreased trunk control, and increased need for supplemental O2 impacting ADLs. Patient would continue to benefit from skilled OT services at this time while admitted and after d/c to address noted deficits in order to improve overall safety and independence in ADLs.        Recommendations for follow up therapy are one component of a multi-disciplinary discharge planning process, led by the attending physician.  Recommendations may be updated based on patient status, additional functional criteria and insurance authorization.   Follow Up Recommendations  Skilled nursing-short term rehab (<3 hours/day)    Assistance Recommended at Discharge Frequent or constant Supervision/Assistance  Patient can return home with the following A lot of help with walking and/or transfers;A lot of help with bathing/dressing/bathroom;Assistance with cooking/housework;Direct supervision/assist for financial management;Assist for transportation;Help with stairs or ramp for entrance;Direct supervision/assist for medications management    Functional Status Assessment  Patient has had a recent decline in their functional status and demonstrates the ability to make significant improvements in function in a reasonable and predictable amount of time.  Equipment Recommendations   Other (comment) (TBD)    Recommendations for Other Services       Precautions / Restrictions Precautions Precautions: Fall Restrictions Weight Bearing Restrictions: No      Mobility Bed Mobility Overal bed mobility: Needs Assistance Bed Mobility: Supine to Sit     Supine to sit: Min guard, HOB elevated     General bed mobility comments: use of rail, extra time and cues for safety    Transfers                          Balance Overall balance assessment: Needs assistance Sitting-balance support: Feet supported, Bilateral upper extremity supported Sitting balance-Leahy Scale: Poor     Standing balance support: Bilateral upper extremity supported, Reliant on assistive device for balance, During functional activity Standing balance-Leahy Scale: Poor Standing balance comment: support for balance and trunk control                           ADL either performed or assessed with clinical judgement   ADL Overall ADL's : Needs assistance/impaired Eating/Feeding: Set up;Sitting   Grooming: Minimal assistance;Sitting Grooming Details (indicate cue type and reason): patient declined to complete oral hygiene reporting she did it already this AM.     Lower Body Bathing: Moderate assistance;Sit to/from stand;Sitting/lateral leans Lower Body Bathing Details (indicate cue type and reason): patient was able to don bilateral socks at bed level with increased time and noted [facility] deficits with attempting to open socks to put them on feet with increased time. noted to have O2 cord on digits with increased difficulty with manipulation could be contributing. Upper Body Dressing : Minimal assistance;Sitting   Lower Body Dressing: Moderate assistance;Sit to/from stand;Sitting/lateral leans   Toilet Transfer: Moderate assistance;+2 for physical assistance;+2 for  safety/equipment Toilet Transfer Details (indicate cue type and reason): to transfer from edge of bed to  recliner in room with increased time and cues for proper hand and foot palcement with RW. Toileting- Clothing Manipulation and Hygiene: Maximal assistance;Sit to/from stand       Functional mobility during ADLs: +2 for physical assistance;+2 for safety/equipment;Moderate assistance       Vision Patient Visual Report: No change from baseline       Perception     Praxis      Pertinent Vitals/Pain Pain Assessment Pain Assessment: No/denies pain     Hand Dominance Right   Extremity/Trunk Assessment Upper Extremity Assessment Upper Extremity Assessment: Generalized weakness;RUE deficits/detail;LUE deficits/detail RUE Deficits / Details: patient was able to range BUE above 110 degrees FF and ABduction with noted inability to hold them up against gravity dropping back to lap at quick pace with cues to control movements.   Lower Extremity Assessment Lower Extremity Assessment: Defer to PT evaluation   Cervical / Trunk Assessment Cervical / Trunk Assessment: Other exceptions Cervical / Trunk Exceptions: tends  to keep head rotated to right, head forward posture when sitting and standing.   Communication Communication Communication: No difficulties (speach is low ansd somewhat slurred at times)   Cognition Arousal/Alertness: Awake/alert Behavior During Therapy: WFL for tasks assessed/performed Overall Cognitive Status: Difficult to assess                                 General Comments: noted to have soft voice with some mumbling at times. patient was oriented to year but not location, month or date. patient appeared suprised when educated she had been here for 7 days     General Comments       Exercises     Shoulder Instructions      Home Living Family/patient expects to be discharged to:: Private residence Living Arrangements: Other (Comment) Available Help at Discharge: Family;Available PRN/intermittently Type of Home: Apartment Home Access: Stairs  to enter Entrance Stairs-Number of Steps: 2 STE   Home Layout: One level                   Additional Comments: patient reported concerns over being able to go back to current place of living reporting she has been gone for over a month?      Prior Functioning/Environment Prior Level of Function : Independent/Modified Independent             Mobility Comments: patient reported using the RW evey now and then when needed. has a rollator at home as well.          OT Problem List: Decreased activity tolerance;Impaired balance (sitting and/or standing);Decreased safety awareness;Cardiopulmonary status limiting activity;Decreased knowledge of precautions;Decreased knowledge of use of DME or AE      OT Treatment/Interventions: Self-care/ADL training;Therapeutic exercise;Neuromuscular education;Energy conservation;DME and/or AE instruction;Therapeutic activities;Balance training;Patient/family education    OT Goals(Current goals can be found in the care plan section) Acute Rehab OT Goals Patient Stated Goal: to get better OT Goal Formulation: With patient Time For Goal Achievement: 05/30/22 Potential to Achieve Goals: Fair  OT Frequency: Min 2X/week    Co-evaluation   Reason for Co-Treatment: For patient/therapist safety;To address functional/ADL transfers PT goals addressed during session: Mobility/safety with mobility OT goals addressed during session: ADL's and self-care      AM-PAC OT "6 Clicks" Daily Activity     Outcome Measure Help from  another person eating meals?: A Little Help from another person taking care of personal grooming?: A Little Help from another person toileting, which includes using toliet, bedpan, or urinal?: Total Help from another person bathing (including washing, rinsing, drying)?: A Lot Help from another person to put on and taking off regular upper body clothing?: A Little Help from another person to put on and taking off regular lower  body clothing?: A Lot 6 Click Score: 14   End of Session Equipment Utilized During Treatment: Gait belt;Rolling walker (2 wheels) Nurse Communication: Mobility status  Activity Tolerance: Patient tolerated treatment well Patient left: in chair;with call bell/phone within reach;with chair alarm set  OT Visit Diagnosis: Unsteadiness on feet (R26.81);Other abnormalities of gait and mobility (R26.89);Muscle weakness (generalized) (M62.81)                Time: 1101-1120 OT Time Calculation (min): 19 min Charges:  OT General Charges $OT Visit: 1 Visit OT Evaluation $OT Eval Moderate Complexity: 1 Mod  Sharyn Blitz OTR/L, MS Acute Rehabilitation Department Office# [phone] Pager# [phone]   Ardyth Harps 05/16/2022, 12:47 PM

## 2022-05-16 NOTE — Progress Notes (Signed)
GI asked RN to get colonoscopy records from the [place]. [facility] has been called twice and no one is answering . Will attempt again later.

## 2022-05-17 DIAGNOSIS — G9341 Metabolic encephalopathy: Secondary | ICD-10-CM | POA: Diagnosis not present

## 2022-05-17 DIAGNOSIS — E440 Moderate protein-calorie malnutrition: Secondary | ICD-10-CM | POA: Diagnosis not present

## 2022-05-17 DIAGNOSIS — D62 Acute posthemorrhagic anemia: Secondary | ICD-10-CM | POA: Diagnosis not present

## 2022-05-17 DIAGNOSIS — K922 Gastrointestinal hemorrhage, unspecified: Secondary | ICD-10-CM | POA: Diagnosis not present

## 2022-05-17 LAB — GLUCOSE, CAPILLARY
Glucose-Capillary: 107 mg/dL — ABNORMAL HIGH (ref 70–99)
Glucose-Capillary: 75 mg/dL (ref 70–99)
Glucose-Capillary: 76 mg/dL (ref 70–99)
Glucose-Capillary: 83 mg/dL (ref 70–99)
Glucose-Capillary: 85 mg/dL (ref 70–99)
Glucose-Capillary: 97 mg/dL (ref 70–99)
Glucose-Capillary: 99 mg/dL (ref 70–99)

## 2022-05-17 LAB — MAGNESIUM: Magnesium: 2 mg/dL (ref 1.7–2.4)

## 2022-05-17 MED ORDER — ENSURE ENLIVE PO LIQD
237.0000 mL | Freq: Three times a day (TID) | ORAL | Status: DC
  Administered 2022-05-17 – 2022-05-18 (×4): 237 mL via ORAL

## 2022-05-17 MED ORDER — ACETAMINOPHEN 325 MG PO TABS
650.0000 mg | ORAL_TABLET | Freq: Four times a day (QID) | ORAL | Status: DC | PRN
  Administered 2022-05-17 (×2): 650 mg via ORAL
  Filled 2022-05-17 (×2): qty 2

## 2022-05-17 NOTE — Progress Notes (Signed)
Calorie Count Note  48 hour calorie count ordered.  Diet: Dysphagia 2 Supplements:  -Ensure Plus High Protein po BID, each supplement provides 350 kcal and 20 grams of protein.  -1 packet Juven BID, each provides 95 kcals and 2.5g protein -for wound healing  5/22: Breakfast: 600 kcals, 24g protein Lunch: Pt reports not having any lunch Dinner: 75 kcals, 5g protein Supplements: 1 Ensure -350 kcals, 20g protein, some Juven   Total intake: 1025 kcal (56% of minimum estimated needs)  49g protein (49% of minimum estimated needs)  5/23: (so far) B: 358 kcals, 11g protein 1 Ensure-350 kcals, 20g protein  Nutrition Dx: Moderate Malnutrition related to social / environmental circumstances (self-neglect, EtOH abuse) as evidenced by mild fat depletion, moderate muscle depletion.    Goal: Pt to meet >/= 90% of their estimated nutrition needs   Intervention:  -Continue Ensure Plus High Protein TID -Continue Juven BID -Multivitamin with minerals daily -Vitamin C BID  Tilda Franco, MS, RD, LDN Inpatient Clinical Dietitian Contact information available via Amion

## 2022-05-17 NOTE — NC FL2 (Signed)
[place] MEDICAID FL2 LEVEL OF CARE SCREENING TOOL     IDENTIFICATION  Patient Name: Katie Young Birthdate: 02/24/1976 Sex: female Admission Date (Current Location): 05/08/2022  [facility] [place]   Facility and Address:  [facility],  [address], [place] [id number]      Provider Number: [id number]  Attending Physician Name and Address:  David StallFeliz Ortiz, Darin EngelsAbraham, MD  Relative Name and Phone Number:       Current Level of Care: Hospital Recommended Level of Care: Skilled Nursing Facility Prior Approval Number:    Date Approved/Denied:   PASRR Number: [id number]  Discharge Plan: SNF    Current Diagnoses: Patient Active Problem List   Diagnosis Date Noted   Acute respiratory failure with hypoxia (HCC) 05/16/2022   Aspiration pneumonia (HCC) 05/16/2022   Pressure injury of skin 05/09/2022   Malnutrition of moderate degree 05/09/2022   Acute upper GI bleed 05/08/2022   Regurgitation of food 12/28/2021   Constipation 12/28/2021   Internal orthopedic device with infection or inflammatory reaction (HCC) 04/21/2014   Painful orthopaedic hardware (HCC) 04/21/2014   Alcohol abuse 06/03/2009   Pain in limb 06/03/2009   OBESITY 06/01/2009   HYPERLIPIDEMIA 03/14/2008    Orientation RESPIRATION BLADDER Height & Weight     Self, Time, Situation, Place  Normal Continent Weight: 72.9 kg Height:  5\' 9"  (175.3 cm)  BEHAVIORAL SYMPTOMS/MOOD NEUROLOGICAL BOWEL NUTRITION STATUS      Continent Diet (regular)  AMBULATORY STATUS COMMUNICATION OF NEEDS Skin   Extensive Assist Verbally Normal                       Personal Care Assistance Level of Assistance  Bathing, Feeding, Dressing Bathing Assistance: Limited assistance Feeding assistance: Limited assistance Dressing Assistance: Limited assistance     Functional Limitations Info  Sight, Hearing, Speech Sight Info: Adequate Hearing Info: Adequate Speech Info: Adequate     SPECIAL CARE FACTORS FREQUENCY  PT (By licensed PT), OT (By licensed OT)     PT Frequency: 5 x weekly OT Frequency: 5 x weekly            Contractures Contractures Info: Not present    Additional Factors Info  Code Status Code Status Info: full             Current Medications (05/17/2022):  This is the current hospital active medication list Current Facility-Administered Medications  Medication Dose Route Frequency Provider Last Rate Last Admin   albuterol (PROVENTIL) (2.5 MG/3ML) 0.083% nebulizer solution 2.5 mg  2.5 mg Nebulization Q4H PRN Coralyn HellingSood, Vineet, MD       arformoterol (BROVANA) nebulizer solution 15 mcg  15 mcg Nebulization BID Coralyn HellingSood, Vineet, MD   15 mcg at 05/17/22 [id number]   ascorbic acid (VITAMIN C) tablet 500 mg  500 mg Oral BID Marinda ElkFeliz Ortiz, Abraham, MD   500 mg at 05/17/22 0929   budesonide (PULMICORT) nebulizer solution 0.5 mg  0.5 mg Nebulization BID Canary Brimllis, Brandi L, NP   0.5 mg at 05/17/22 0843   chlorhexidine (PERIDEX) 0.12 % solution 15 mL  15 mL Mouth Rinse BID Martina Sinnerewald, Jonathan B, MD   15 mL at 05/17/22 [id number]   Chlorhexidine Gluconate Cloth 2 % PADS 6 each  6 each Topical Daily Martina Sinnerewald, Jonathan B, MD   6 each at 05/16/22 2118   feeding supplement (ENSURE ENLIVE / ENSURE PLUS) liquid 237 mL  237 mL Oral BID BM Marinda ElkFeliz Ortiz, Abraham, MD  237 mL at [id number]   folic acid (FOLVITE) tablet 1 mg  1 mg Per Tube Daily Chesley Mires, MD   1 mg at 05/17/22 0929   insulin aspart (novoLOG) injection 0-15 Units  0-15 Units Subcutaneous Q4H Collier Bullock, MD   2 Units at 05/15/22 1645   MEDLINE mouth rinse  15 mL Mouth Rinse q12n4p Freddi Starr, MD   15 mL at 05/16/22 1653   multivitamin with minerals tablet 1 tablet  1 tablet Oral Daily Charlynne Cousins, MD   1 tablet at 05/17/22 [id number]   nutrition supplement (JUVEN) (JUVEN) powder packet 1 packet  1 packet Oral BID BM Charlynne Cousins, MD   1 packet at 05/17/22 0927   pantoprazole (PROTONIX) EC tablet 40  mg  40 mg Oral BID Charlynne Cousins, MD   40 mg at 05/17/22 [id number]   prochlorperazine (COMPAZINE) injection 5 mg  5 mg Intravenous Q6H PRN Elsie Lincoln, MD       revefenacin (YUPELRI) nebulizer solution 175 mcg  175 mcg Nebulization Daily Chesley Mires, MD   175 mcg at 05/17/22 [id number]   thiamine tablet 100 mg  100 mg Per Tube Daily Chesley Mires, MD   100 mg at 05/17/22 [id number]     Discharge Medications: Please see discharge summary for a list of discharge medications.  Relevant Imaging Results:  Relevant Lab Results:   Additional Information ssn=[id number]  Leeroy Cha, RN

## 2022-05-17 NOTE — Progress Notes (Signed)
[facility] PROGRESS NOTE    Progress Note  Katie Young  [id number] DOB: 07/25/1976 DOA: 05/08/2022 PCP: Leonie Douglas, MD     Brief Narrative:   Katie Young is an 46 y.o. female past medical history of alcohol abuse admitted on 05/08/2022 for acute encephalopathy hematemesis and black tarry stools for 2 days presented to [facility] was found profoundly hypotensive was fluid resuscitated started on pressors.  According to the daughter she started drinking heavily again when her daughter went to the home she found her covered in black tarry stools and emesis lethargic.  Awaiting skilled nursing facility placement  Assessment/Plan:   Hematemesis secondary to acute upper GI bleed/acute blood loss anemia: She had an EGD at [facility] on January 2023 that showed esophagitis no varices.   On admission She was started on octreotide which was DC'd on 05/10/2022. GI was consulted recommended no further evaluation or scoping at this time as her bleeding has subsided. Continue oral PPI thiamine and folate. Her hemoglobin has remained stable. Physical therapy evaluated the patient she will need skilled nursing facility.  Acute metabolic encephalopathy: She has a history of seizures since childhood. Suspect multifactorial in the setting of intoxication sepsis. MRI on 05/14/2022 showed no acute findings. Mental status is improved now.  Acute respiratory failure with hypoxia/COPD on home oxygen/aspiration pneumonia probably due to Proteus Mirabella's: She is status post extubation 05/11/2022. She definitely aspirated in the setting of intoxication and GI bleed. Her Proteus was pansensitive. She has completed course of antibiotics in house.  Hypotension/relative adrenal insufficiency: In the setting of hypovolemia GI bleed sedation and sepsis. Of infection with maggots of the left lower extremity. She is now off Levophed She completed a stress dose steroid which  were stopped on 05/14/2022.  Chronic left lower extremity infection: Last seen at the wound care clinic on 10/05/2021 she was started on Augmentin unsure if she finished her treatment. Wound care was consulted, who related appears to be infected with maggots, which apparently the majority were flushed out by the wound care nurse. Who recommended daily skin hygiene measures with sterile normal saline and cover with daily gauze.  Electrolyte imbalance: Hyponatremia, hypokalemia and hypophosphatemia: Repleted orally and with free water flushes per tube. Her potassium is low this morning replete orally recheck in the morning. Currently potassium close to 4 magnesium greater than 2.  Anemia in the setting of GI bleed and iron deficiency: Status post 2 units of packed red blood cells her hemoglobin this morning is 8.1.  Thrombocytopenia: Probably chronic in setting of alcohol abuse.  Severe protein caloric malnutrition: With an albumin of 1.7 was on enteral nutrition per tube. She tried her food yesterday ate about 25 to 50%. Nutrition has been consulted. Continue to monitor strict I's and O's on a dysphagia 2 diet. Continue Ensure 3 times daily. Malnutrition Type:  Nutrition Problem: Moderate Malnutrition Etiology: social / environmental circumstances (self-neglect, EtOH abuse)   Malnutrition Characteristics:  Signs/Symptoms: mild fat depletion, moderate muscle depletion   Nutrition Interventions:  Interventions: Tube feeding, Prostat, MVI    DVT prophylaxis: lovenox Family Communication:daughter Status is: Inpatient Remains inpatient appropriate because: Sepsis and GI bleed    Code Status:     Code Status Orders  (From admission, onward)           Start     Ordered   05/08/22 1948  Full code  Continuous        05/08/22 1955  Code Status History     Date Active Date Inactive Code Status Order ID Comments User Context   12/24/2021 1513  12/24/2021 2236 Full Code [id number]  Cherre Robins, MD Inpatient         IV Access:   Peripheral IV   Procedures and diagnostic studies:   No results found.   Medical Consultants:   None.   Subjective:    Katie Young tolerating her diet no complaints.   Objective:    Vitals:   05/17/22 0113 05/17/22 0500 05/17/22 0502 05/17/22 0844  BP: 121/67  120/75   Pulse: 67  73   Resp: 20  18   Temp: 98.5 F (36.9 C)  98.3 F (36.8 C)   TempSrc:      SpO2: 98%  100% 95%  Weight:  72.9 kg    Height:       SpO2: 95 % O2 Flow Rate (L/min): 2 L/min FiO2 (%): 28 %   Intake/Output Summary (Last 24 hours) at 05/17/2022 0909 Last data filed at 05/16/2022 2129 Gross per 24 hour  Intake 200 ml  Output 1300 ml  Net -1100 ml    Filed Weights   05/16/22 0500 05/16/22 1646 05/17/22 0500  Weight: 73.6 kg 71.9 kg 72.9 kg    Exam: General exam: In no acute distress. Respiratory system: Good air movement and clear to auscultation. Cardiovascular system: S1 & S2 heard, RRR. No JVD. Gastrointestinal system: Abdomen is nondistended, soft and nontender.  Extremities: No pedal edema. Skin: No rashes, lesions or ulcers Psychiatry: Judgement and insight appear normal. Mood & affect appropriate.   Data Reviewed:    Labs: Basic Metabolic Panel: Recent Labs  Lab 05/11/22 0535 05/12/22 0440 05/13/22 0405 05/13/22 1058 05/13/22 1647 05/14/22 0433 05/14/22 1714 05/15/22 0854 05/16/22 0721 05/17/22 0342  NA 149* 149* 142  --   --  148*  --  143 143  --   K 3.5 3.6 3.7  --   --  3.5  --  2.9* 3.2*  --   CL 116* 113* 109  --   --  113*  --  109 107  --   CO2 27 30 30   --   --  32  --  32 31  --   GLUCOSE 124* 104* 125*  --   --  140*  --  94 97  --   BUN 16 7 12   --   --  21*  --  20 16  --   CREATININE 0.65 0.58 0.62  --   --  0.62  --  0.60 0.54  --   CALCIUM 7.9* 8.1* 8.0*  --   --  8.9  --  8.5* 8.9  --   MG 2.0  --   --  1.8 1.8 2.0 2.0  --  1.7 2.0   PHOS 3.7  --   --  3.8 3.3 3.0 2.5  --   --   --     GFR Estimated Creatinine Clearance: 90.9 mL/min (by C-G formula based on SCr of 0.54 mg/dL). Liver Function Tests: Recent Labs  Lab 05/12/22 0440 05/13/22 0405 05/14/22 0433 05/15/22 0854  AST 23 21 14* 14*  ALT 25 24 19 16   ALKPHOS 122 133* 100 100  BILITOT 0.7 0.6 0.5 0.4  PROT 5.0* 5.1* 5.6* 4.9*  ALBUMIN 1.7* 1.7* 2.8* 2.3*    No results for input(s): LIPASE, AMYLASE in the last 168 hours. Recent Labs  Lab  05/12/22 0850  AMMONIA 13    Coagulation profile No results for input(s): INR, PROTIME in the last 168 hours. COVID-19 Labs  No results for input(s): DDIMER, FERRITIN, LDH, CRP in the last 72 hours.  No results found for: SARSCOV2NAA  CBC: Recent Labs  Lab 05/13/22 0405 05/14/22 0433 05/14/22 0551 05/14/22 1404 05/15/22 0854 05/16/22 0721  WBC 5.2 3.6* 3.9*  --  5.6 6.9  HGB 7.4* 6.4* 6.5* 8.0* 8.1* 9.5*  HCT 23.7* 20.9* 21.2* 24.8* 25.3* 30.4*  MCV 107.2* 109.4* 109.8*  --  102.4* 102.0*  PLT 93* 72* 71*  --  103* 135*    Cardiac Enzymes: No results for input(s): CKTOTAL, CKMB, CKMBINDEX, TROPONINI in the last 168 hours. BNP (last 3 results) No results for input(s): PROBNP in the last 8760 hours. CBG: Recent Labs  Lab 05/16/22 1645 05/16/22 2043 05/17/22 0116 05/17/22 0504 05/17/22 0814  GLUCAP 73 93 76 75 85    D-Dimer: No results for input(s): DDIMER in the last 72 hours. Hgb A1c: No results for input(s): HGBA1C in the last 72 hours. Lipid Profile: No results for input(s): CHOL, HDL, LDLCALC, TRIG, CHOLHDL, LDLDIRECT in the last 72 hours. Thyroid function studies: No results for input(s): TSH, T4TOTAL, T3FREE, THYROIDAB in the last 72 hours.  Invalid input(s): FREET3 Anemia work up: No results for input(s): VITAMINB12, FOLATE, FERRITIN, TIBC, IRON, RETICCTPCT in the last 72 hours. Sepsis Labs: Recent Labs  Lab 05/14/22 0433 05/14/22 0551 05/15/22 0854 05/16/22 0721  WBC  3.6* 3.9* 5.6 6.9    Microbiology Recent Results (from the past 240 hour(s))  MRSA Next Gen by PCR, Nasal     Status: None   Collection Time: 05/08/22  7:27 PM   Specimen: Nasal Mucosa; Nasal Swab  Result Value Ref Range Status   MRSA by PCR Next Gen NOT DETECTED NOT DETECTED Final    Comment: (NOTE) The GeneXpert MRSA Assay (FDA approved for NASAL specimens only), is one component of a comprehensive MRSA colonization surveillance program. It is not intended to diagnose MRSA infection nor to guide or monitor treatment for MRSA infections. Test performance is not FDA approved in patients less than 36 years old. Performed at [facility], [place] [address], [place], [place] [address]   Culture, blood (Routine X 2) w Reflex to ID Panel     Status: None   Collection Time: 05/08/22  8:21 PM   Specimen: BLOOD LEFT ARM  Result Value Ref Range Status   Specimen Description   Final    BLOOD LEFT ARM Performed at [facility], [place] [address], [place], [place] [address]    Special Requests   Final    BOTTLES DRAWN AEROBIC ONLY Blood Culture adequate volume Performed at [place] [address], [place], [place] [address]    Culture   Final    NO GROWTH 5 DAYS Performed at [facility], [place] [address] [place], [place] [address]    Report Status 05/13/2022 FINAL  Final  Culture, blood (Routine X 2) w Reflex to ID Panel     Status: None   Collection Time: 05/08/22  8:21 PM   Specimen: BLOOD  Result Value Ref Range Status   Specimen Description   Final    BLOOD LEFT ANTECUBITAL Performed at [place] [address], [place],  [address]    Special Requests   Final    BOTTLES DRAWN AEROBIC ONLY Blood Culture adequate volume  Performed at [facility], [place] [address], [place], [place] [address]    Culture   Final    NO GROWTH 5 DAYS Performed at [facility], [place] [address]  [place], [place] [address]    Report Status 05/13/2022 FINAL  Final  Culture, Respiratory w Gram Stain (tracheal aspirate)     Status: None   Collection Time: 05/09/22  3:00 AM   Specimen: Tracheal Aspirate; Respiratory  Result Value Ref Range Status   Specimen Description   Final    TRACHEAL ASPIRATE Performed at [place] [address] [place], [place] [address]    Special Requests   Final    NONE Performed at [facility], [place] [address] [place], [place] [address]    Gram Stain   Final    FEW WBC PRESENT, PREDOMINANTLY PMN RARE GRAM POSITIVE COCCI IN TETRADS RARE BUDDING YEAST SEEN    Culture   Final    RARE PROTEUS MIRABILIS WITH IN MIXED ORGANISMS Performed at [facility], [place] [address] [place], [place] [address]    Report Status 05/11/2022 FINAL  Final   Organism ID, Bacteria PROTEUS MIRABILIS  Final      Susceptibility   Proteus mirabilis - MIC*    AMPICILLIN <=2 SENSITIVE Sensitive     CEFAZOLIN 8 SENSITIVE Sensitive     CEFEPIME 0.25 SENSITIVE Sensitive     CEFTAZIDIME <=1 SENSITIVE Sensitive     CEFTRIAXONE <=0.25 SENSITIVE Sensitive     CIPROFLOXACIN <=0.25 SENSITIVE Sensitive     GENTAMICIN <=1 SENSITIVE Sensitive     IMIPENEM 4 SENSITIVE Sensitive     TRIMETH/SULFA <=20 SENSITIVE Sensitive     AMPICILLIN/SULBACTAM <=2 SENSITIVE Sensitive     PIP/TAZO <=4 SENSITIVE Sensitive     * RARE PROTEUS MIRABILIS     Medications:    arformoterol  15 mcg Nebulization BID   vitamin C  500 mg Oral BID   budesonide (PULMICORT) nebulizer solution  0.5 mg Nebulization BID   chlorhexidine  15 mL Mouth Rinse BID   Chlorhexidine Gluconate Cloth  6 each Topical Daily   feeding supplement  237 mL Oral BID BM   folic acid  1 mg Per Tube Daily   insulin aspart  0-15 Units Subcutaneous Q4H   mouth rinse  15 mL Mouth Rinse q12n4p   multivitamin with minerals  1 tablet Oral Daily   nutrition supplement (JUVEN)  1 packet Oral  BID BM   pantoprazole  40 mg Oral BID   potassium chloride  40 mEq Oral BID   revefenacin  175 mcg Nebulization Daily   thiamine  100 mg Per Tube Daily   Continuous Infusions:      LOS: 9 days   Charlynne Cousins  [facility]  05/17/2022, 9:09 AM

## 2022-05-17 NOTE — TOC Progression Note (Signed)
Transition of Care [facility]) - Progression Note    Patient Details  Name: Katie Young MRN: [id number] Date of Birth: 1976/01/24  [facility]) CM/SW Contact  Golda Acre, RN Phone Number: 05/17/2022, 8:11 AM  Clinical Narrative:    Pt will need snf has medicaid may be unable to find a medicaid bed will sent out to the areas.   Expected Discharge Plan: Skilled Nursing Facility Barriers to Discharge: Continued Medical Work up  Expected Discharge Plan and Services Expected Discharge Plan: Skilled Nursing Facility In-house Referral: Clinical Social Work     Living arrangements for the past 2 months: Apartment                                       Social Determinants of Health (SDOH) Interventions    Readmission Risk Interventions     View : No data to display.

## 2022-05-18 DIAGNOSIS — F1010 Alcohol abuse, uncomplicated: Secondary | ICD-10-CM | POA: Diagnosis not present

## 2022-05-18 DIAGNOSIS — R5381 Other malaise: Secondary | ICD-10-CM

## 2022-05-18 DIAGNOSIS — J449 Chronic obstructive pulmonary disease, unspecified: Secondary | ICD-10-CM

## 2022-05-18 DIAGNOSIS — L89520 Pressure ulcer of left ankle, unstageable: Secondary | ICD-10-CM

## 2022-05-18 DIAGNOSIS — J690 Pneumonitis due to inhalation of food and vomit: Secondary | ICD-10-CM | POA: Diagnosis not present

## 2022-05-18 DIAGNOSIS — K922 Gastrointestinal hemorrhage, unspecified: Secondary | ICD-10-CM | POA: Diagnosis not present

## 2022-05-18 DIAGNOSIS — J9601 Acute respiratory failure with hypoxia: Secondary | ICD-10-CM | POA: Diagnosis not present

## 2022-05-18 DIAGNOSIS — J9621 Acute and chronic respiratory failure with hypoxia: Secondary | ICD-10-CM

## 2022-05-18 LAB — GLUCOSE, CAPILLARY
Glucose-Capillary: 75 mg/dL (ref 70–99)
Glucose-Capillary: 83 mg/dL (ref 70–99)
Glucose-Capillary: 87 mg/dL (ref 70–99)
Glucose-Capillary: 107 mg/dL — ABNORMAL HIGH (ref 70–99)
Glucose-Capillary: 104 mg/dL — ABNORMAL HIGH (ref 70–99)

## 2022-05-18 MED ORDER — THIAMINE HCL 100 MG PO TABS: 100.0000 mg | ORAL_TABLET | Freq: Every day | ORAL | Status: AC

## 2022-05-18 MED ORDER — ATORVASTATIN CALCIUM 40 MG PO TABS: 40.0000 mg | ORAL_TABLET | Freq: Every day | ORAL | 11 refills | Status: AC

## 2022-05-18 MED ORDER — ADULT MULTIVITAMIN W/MINERALS CH: 1.0000 | ORAL_TABLET | Freq: Every day | ORAL | Status: AC

## 2022-05-18 MED ORDER — JUVEN PO PACK: 1.0000 | PACK | Freq: Two times a day (BID) | ORAL | 0 refills | Status: AC

## 2022-05-18 MED ORDER — ONDANSETRON HCL 4 MG PO TABS: 4.0000 mg | ORAL_TABLET | Freq: Every day | ORAL | 0 refills | Status: AC | PRN

## 2022-05-18 MED ORDER — ASCORBIC ACID 500 MG PO TABS: 500.0000 mg | ORAL_TABLET | Freq: Two times a day (BID) | ORAL | Status: AC

## 2022-05-18 MED ORDER — FOLIC ACID 1 MG PO TABS: 1.0000 mg | ORAL_TABLET | Freq: Every day | ORAL | Status: AC

## 2022-05-18 MED ORDER — TRELEGY ELLIPTA 100-62.5-25 MCG/ACT IN AEPB: 1.0000 | INHALATION_SPRAY | Freq: Every day | RESPIRATORY_TRACT | 1 refills | Status: AC

## 2022-05-18 MED ORDER — PANTOPRAZOLE SODIUM 40 MG PO TBEC: 40.0000 mg | DELAYED_RELEASE_TABLET | Freq: Two times a day (BID) | ORAL | Status: AC

## 2022-05-18 NOTE — Progress Notes (Signed)
Calorie Count Note   48 hour calorie count ordered.   Diet: Dysphagia 2 Supplements:  -Ensure Plus High Protein po BID, each supplement provides 350 kcal and 20 grams of protein.  -1 packet Juven BID, each provides 95 kcals and 2.5g protein -for wound healing   5/23: B: 358 kcals, 11g protein Lunch: 210 kcals, 11g protein Dinner: not documented Supplements: 2 Ensures -700 kcals, 40g protein, some Juven    Total intake: 1268 kcal (70% of minimum estimated needs)  62g protein (62% of minimum estimated needs)    Nutrition Dx: Moderate Malnutrition related to social / environmental circumstances (self-neglect, EtOH abuse) as evidenced by mild fat depletion, moderate muscle depletion.     Goal: Pt to meet >/= 90% of their estimated nutrition needs    Intervention:  -Continue Ensure Plus High Protein TID -Continue Juven BID -Multivitamin with minerals daily -Vitamin C BID   Tilda Franco, MS, RD, LDN Inpatient Clinical Dietitian Contact information available via Amion

## 2022-05-18 NOTE — Progress Notes (Signed)
Pt picked up via [facility] to transport to [facility]. Belongings with pt.

## 2022-05-18 NOTE — Discharge Summary (Signed)
Physician Discharge Summary  Katie Young ZOX:[id number] DOB: 1976-02-03 DOA: 05/08/2022  PCP: Waldon Reining, MD  Admit date: 05/08/2022 Discharge date: 05/18/2022 Admitted From: Home Disposition: SNF Recommendations for Outpatient Follow-up:  Follow ups as below. Please obtain CBC/CMP/Mag at follow up Please follow up on the following pending results: None  Home Health: Patient is discharged to SNF  Discharge Condition: Stable CODE STATUS: Full code   Hospital course 46 year old F with PMH of alcohol abuse, COPD, chronic hypoxic respiratory failure on 4 L and malnutrition presenting with altered mental status, hematemesis, melena and low blood pressure, and admitted for acute metabolic encephalopathy, acute on chronic respiratory failure, aspiration pneumonia, hypotension and severe protein calorie malnutrition.  She was intubated and admitted to ICU.  Encephalopathy felt to be due to alcohol, acute on chronic respiratory failure and aspiration pneumonia.  MRI brain without acute finding.  No hypercapnia on ABG.  She completed antibiotic course for aspiration pneumonia.  She was extubated on 05/11/2022 and transferred to regular floor.  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  She was initially started on octreotide and IV PPI.  GI was consulted and did not feel further GI evaluation is indicated.  H&H remained stable.  She is discharged on p.o. Protonix.   On the day of discharge, encephalopathy resolved.  She is on 1 L by nasal cannula at rest saturating in upper 90s.  She is normotensive.  She is discharged to SNF per recommendation by therapy.  See individual problem list below for more on hospital course.  Problems addressed during this hospitalization Acute on chronic blood loss anemia/iron deficiency anemia-transfused 2 units with appropriate response.  H&H stable Hematemesis/melena/possible upper GI  bleed-resolved. -Continue Protonix -Check CBC in 1 to 2 weeks  Acute metabolic encephalopathy: Multifactorial.  Resolved.  Oriented x4 except date. -Avoid or minimize sedating medications  Acute on chronic respiratory failure with hypoxia/aspiration pneumonia/chronic COPD-on 4 L at baseline.  Now saturating in upper 90s on 2 L. -Extubated on 05/11/2022. -Completed antibiotic course for aspiration pneumonia -Discharged on Trelegy and as needed DuoNeb  Chronic left ankle wound.  Followed by vascular surgery outpatient.  Reportedly has normal ABI -High intensity statin -No aspirin due to hematemesis/melena. -daily skin hygiene measures with sterile normal saline and cover with daily gauze. -Outpatient follow-up with vascular surgery as previously planned  Hyponatremia/hypokalemia/hypophosphatemia: Resolved.  Alcohol use disorder -Encourage alcohol cessation. -Continue multivitamins, folic acid and thiamine  Poor dentition: -Needs outpatient follow-up with his dentist  Severe protein calorie malnutrition Nutrition Problem: Moderate Malnutrition Etiology: social / environmental circumstances (self-neglect, EtOH abuse) Signs/Symptoms: mild fat depletion, moderate muscle depletion Interventions: Tube feeding, Prostat, MVI       Vital signs Vitals:   05/18/22 0418 05/18/22 0500 05/18/22 0833 05/18/22 1219  BP: 103/66   91/64  Pulse: 86   79  Temp: 98.3 F (36.8 C)   98.3 F (36.8 C)  Resp: 20   18  Height:      Weight:  72.4 kg    SpO2: 96%  96% 98%  TempSrc: Oral   Oral  BMI (Calculated):  23.56       Discharge exam  GENERAL: Appears frail.  Nontoxic. HEENT: MMM.  Vision and hearing grossly intact.  NECK: Supple.  No apparent JVD.  RESP:  No IWOB.  Fair aeration bilaterally. CVS:  RRR. Heart sounds normal.  ABD/GI/GU: BS+. Abd soft, NTND.  MSK/EXT:  Moves extremities.  Significant muscle mass and subcu fat loss. SKIN: Chronic left ankle wound NEURO: Awake and  alert. Oriented x4 except date.  No apparent focal neuro deficit. PSYCH: Calm. Normal affect.   Discharge Instructions Discharge Instructions     Diet general   Complete by: As directed    Discharge wound care:   Complete by: As directed    Hold the LLE over the trash can. Pour either sterile water or normal saline over the wound to flush out any loose maggots. Rinse the wound well.  Fill the wound bed with dry gauze, cover with dry gauze, use a small amount of kerlix to hold the dressing in place.   Increase activity slowly   Complete by: As directed       Allergies as of 05/18/2022       Reactions   Sulfa Antibiotics Nausea And Vomiting        Medication List     STOP taking these medications    albuterol (2.5 MG/3ML) 0.083% nebulizer solution Commonly known as: PROVENTIL       TAKE these medications    ascorbic acid 500 MG tablet Commonly known as: VITAMIN C Take 1 tablet (500 mg total) by mouth 2 (two) times daily.   atorvastatin 40 MG tablet Commonly known as: Lipitor Take 1 tablet (40 mg total) by mouth daily.   folic acid 1 MG tablet Commonly known as: FOLVITE Place 1 tablet (1 mg total) into feeding tube daily. Start taking on: May 19, 2022   ipratropium-albuterol 0.5-2.5 (3) MG/3ML Soln Commonly known as: DUONEB Take 3 mLs by nebulization every 6 (six) hours as needed (Shortness of Breath or Wheezing).   multivitamin with minerals Tabs tablet Take 1 tablet by mouth daily. Start taking on: May 19, 2022   nutrition supplement (JUVEN) Pack Take 1 packet by mouth 2 (two) times daily between meals.   ondansetron 4 MG tablet Commonly known as: Zofran Take 1 tablet (4 mg total) by mouth daily as needed for up to 365 doses for nausea or vomiting.   OXYGEN Inhale 4 L into the lungs continuous.   pantoprazole 40 MG tablet Commonly known as: PROTONIX Take 1 tablet (40 mg total) by mouth 2 (two) times daily.   thiamine 100 MG tablet Place 1 tablet  (100 mg total) into feeding tube daily. Start taking on: May 19, 2022   Trelegy Ellipta 100-62.5-25 MCG/ACT Aepb Generic drug: Fluticasone-Umeclidin-Vilant Inhale 1 puff into the lungs daily.               Discharge Care Instructions  (From admission, onward)           Start     Ordered   05/18/22 0000  Discharge wound care:       Comments: Hold the LLE over the trash can. Pour either sterile water or normal saline over the wound to flush out any loose maggots. Rinse the wound well.  Fill the wound bed with dry gauze, cover with dry gauze, use a small amount of kerlix to hold the dressing in place.   05/18/22 1238            Consultations: Pulmonology Gastroenterology Interventional radiology  Procedures/Studies: Intubation and extubation.   DG Abd 1 View  Result Date: 05/15/2022 CLINICAL DATA:  NG placement. EXAM: ABDOMEN - 1 VIEW COMPARISON:  Earlier radiograph dated 05/14/2022. FINDINGS: Interval advancement of the enteric tube with side-port chest distal to the GE junction and tip in the proximal stomach.  No other interval change. IMPRESSION: Enteric tube with tip in the proximal stomach. Electronically Signed   By: Elgie Collard M.D.   On: 05/15/2022 00:06   DG Abd 1 View  Result Date: 05/14/2022 CLINICAL DATA:  Tube placement. EXAM: ABDOMEN - 1 VIEW COMPARISON:  Radiograph 05/12/2022 FINDINGS: Tip of the enteric tube is below the diaphragm in the stomach, the side port is in the region of the gastroesophageal junction. Air-filled stomach without significant gaseous gastric distension. There is bibasilar airspace disease, increased on the left from prior abdominal radiograph. IMPRESSION: Tip of the enteric tube below the diaphragm in the stomach, side-port in the region of the gastroesophageal junction. Recommend advancement of approximately 5 centimeters for optimal placement. Electronically Signed   By: Narda Rutherford M.D.   On: 05/14/2022 22:17   DG Abd  1 View  Result Date: 05/12/2022 CLINICAL DATA:  Status post nasogastric tube placement. EXAM: ABDOMEN - 1 VIEW COMPARISON:  Same day abdominal radiograph. FINDINGS: An enteric tube terminates in the stomach with the side port near the gastroesophageal junction. This is not significantly changed in position since prior exam. IMPRESSION: Enteric tube terminating in the stomach with side port near the gastroesophageal junction. Electronically Signed   By: Romona Curls M.D.   On: 05/12/2022 14:12   DG Abd 1 View  Result Date: 05/10/2022 CLINICAL DATA:  Check OG tube positioning. EXAM: ABDOMEN - 1 VIEW COMPARISON:  Study of 05/08/2022. FINDINGS: Enteric tube has its tip gastric antrum in good position. The visualized bowel pattern is within normal limits with the pelvic bowel excluded from the exam. No pathologic calcifications or supine findings of free air are seen. Visceral shadows are stable. IMPRESSION: Enteric tube tip is in the gastric antrum. Slightly further into the stomach than previously. The bowel pattern is unremarkable as far seen. Electronically Signed   By: Almira Bar M.D.   On: 05/10/2022 01:38   DG Abd 1 View  Result Date: 05/08/2022 CLINICAL DATA:  Check gastric catheter placement EXAM: ABDOMEN - 1 VIEW COMPARISON:  Chest x-ray from earlier in the same day. FINDINGS: Gastric catheter is noted within the stomach. Scattered large and small bowel gas is noted. No free air is seen. IMPRESSION: Gastric catheter within the stomach. Electronically Signed   By: Alcide Clever M.D.   On: 05/08/2022 20:37   MR BRAIN WO CONTRAST  Result Date: 05/14/2022 CLINICAL DATA:  Neuro deficit, acute, stroke suspected Mental status change, unknown cause EXAM: MRI HEAD WITHOUT CONTRAST TECHNIQUE: Multiplanar, multiecho pulse sequences of the brain and surrounding structures were obtained without intravenous contrast. COMPARISON:  None Available. FINDINGS: Motion artifact is present. Brain: There is no acute  infarction or intracranial hemorrhage. There is no intracranial mass, mass effect, or edema. There is no hydrocephalus or extra-axial fluid collection. No abnormal enhancement. Vascular: Major vessel flow voids at the skull base are preserved. Skull and upper cervical spine: Normal marrow signal is preserved. Sinuses/Orbits: Paranasal sinus mucosal thickening with greatest involvement of the right maxillary sinus and left sphenoid sinus. Orbits are unremarkable. Other: Sella is unremarkable. Right greater than left mastoid effusions. IMPRESSION: No evidence of recent infarction, hemorrhage, or mass. Electronically Signed   By: Guadlupe Spanish M.D.   On: 05/14/2022 13:34   DG Chest Port 1 View  Result Date: 05/13/2022 CLINICAL DATA:  Aspiration pneumonia. EXAM: PORTABLE CHEST 1 VIEW COMPARISON:  05/10/2022. FINDINGS: 4:38 a.m., 05/13/2022. Interval extubation. NGT remains entering the stomach with the intragastric course not filmed.  Right IJ central line tip remains in the distal SVC. The patient is rotated to the right. There is interval worsening consolidation in both lung bases and increasing small layering bilateral pleural effusions. The cardiac size is normal. There is a stable mediastinal configuration. Thoracic cage is intact. IMPRESSION: Worsening patchy consolidation in both lower lung fields and increased small underlying pleural effusions. Interval extubation. Electronically Signed   By: Almira Bar M.D.   On: 05/13/2022 05:52   DG Chest Port 1 View  Result Date: 05/10/2022 CLINICAL DATA:  Ventilator dependent respiratory failure. EXAM: PORTABLE CHEST 1 VIEW COMPARISON:  05/08/2022. FINDINGS: 1:16 a.m., 05/10/2022.  ETT tip is 5.8 cm from the carina. Enteric tube is well inside the stomach with the side hole and tip not included in the exam. Right IJ central line tip remains at about the superior cavoatrial junction. The cardiac size is normal. The mediastinal configuration is stable. There is  increased patchy airspace disease in the left lung base concerning for pneumonia or aspiration. Remaining lungs are clear with relatively decreased lung volumes. No pleural effusion is seen.  No acute osseous abnormality. IMPRESSION: 1. Increased patchy consolidation in the left base concerning for pneumonia or aspiration. Clinical correlation and radiographic follow-up recommended. 2. Support devices as above. Electronically Signed   By: Almira Bar M.D.   On: 05/10/2022 01:35   DG CHEST PORT 1 VIEW  Result Date: 05/08/2022 CLINICAL DATA:  Tube placement EXAM: PORTABLE CHEST 1 VIEW COMPARISON:  Chest x-ray earlier the same day FINDINGS: Endotracheal tube tip is approximately 5 cm above the carina. Enteric tube tip is in the stomach. Stable right internal jugular line with the tip in the SVC. No focal consolidation identified in the lungs. No pleural effusion or pneumothorax appreciated. IMPRESSION: Interval intubation.  Medical devices as described. Electronically Signed   By: Jannifer Hick M.D.   On: 05/08/2022 20:36   DG Abd Portable 1V  Result Date: 05/12/2022 CLINICAL DATA:  Insert tube placement EXAM: PORTABLE ABDOMEN - 1 VIEW COMPARISON:  05/10/2022 abdominal radiograph FINDINGS: Enteric tube terminates in the body of the stomach with side port just below the esophagogastric junction. No dilated small bowel loops in the visualized abdomen. No evidence of pneumatosis or pneumoperitoneum. Superior approach right central venous catheter terminates over the middle third of the SVC. Small right pleural effusion with right lung base atelectasis. IMPRESSION: Enteric tube terminates in the body of the stomach. Small right pleural effusion with right lung base atelectasis. Electronically Signed   By: Delbert Phenix M.D.   On: 05/12/2022 11:49       The results of significant diagnostics from this hospitalization (including imaging, microbiology, ancillary and laboratory) are listed below for  reference.     Microbiology: Recent Results (from the past 240 hour(s))  MRSA Next Gen by PCR, Nasal     Status: None   Collection Time: 05/08/22  7:27 PM   Specimen: Nasal Mucosa; Nasal Swab  Result Value Ref Range Status   MRSA by PCR Next Gen NOT DETECTED NOT DETECTED Final    Comment: (NOTE) The GeneXpert MRSA Assay (FDA approved for NASAL specimens only), is one component of a comprehensive MRSA colonization surveillance program. It is not intended to diagnose MRSA infection nor to guide or monitor treatment for MRSA infections. Test performance is not FDA approved in patients less than 47 years old. Performed at [facility], [address] [place], [place] [address]   Culture, blood (Routine X  2) w Reflex to ID Panel     Status: None   Collection Time: 05/08/22  8:21 PM   Specimen: BLOOD LEFT ARM  Result Value Ref Range Status   Specimen Description   Final    BLOOD LEFT ARM Performed at [facility], [address] [place], [place] [address]    Special Requests   Final    BOTTLES DRAWN AEROBIC ONLY Blood Culture adequate volume Performed at [facility], [address] [place], [place] [address]    Culture   Final    NO GROWTH 5 DAYS Performed at [facility], [address] [place], [place] [address]    Report Status 05/13/2022 FINAL  Final  Culture, blood (Routine X 2) w Reflex to ID Panel     Status: None   Collection Time: 05/08/22  8:21 PM   Specimen: BLOOD  Result Value Ref Range Status   Specimen Description   Final    BLOOD LEFT ANTECUBITAL Performed at [facility], [address] [place], [place] [address]    Special Requests   Final    BOTTLES DRAWN AEROBIC ONLY Blood Culture adequate volume Performed at [facility], [address] [place], [place] [address]    Culture   Final    NO GROWTH 5 DAYS Performed at [facility], [address]  [place], [place] [address]    Report Status 05/13/2022 FINAL  Final  Culture, Respiratory w Gram Stain (tracheal aspirate)     Status: None   Collection Time: 05/09/22  3:00 AM   Specimen: Tracheal Aspirate; Respiratory  Result Value Ref Range Status   Specimen Description   Final    TRACHEAL ASPIRATE Performed at [facility], [address] [place], [place] [address]    Special Requests   Final    NONE Performed at [facility], [address] [place], [place] [address]    Gram Stain   Final    FEW WBC PRESENT, PREDOMINANTLY PMN RARE GRAM POSITIVE COCCI IN TETRADS RARE BUDDING YEAST SEEN    Culture   Final    RARE PROTEUS MIRABILIS WITH IN MIXED ORGANISMS Performed at [facility], [address] [place], [place] [address]    Report Status 05/11/2022 FINAL  Final   Organism ID, Bacteria PROTEUS MIRABILIS  Final      Susceptibility   Proteus mirabilis - MIC*    AMPICILLIN <=2 SENSITIVE Sensitive     CEFAZOLIN 8 SENSITIVE Sensitive     CEFEPIME 0.25 SENSITIVE Sensitive     CEFTAZIDIME <=1 SENSITIVE Sensitive     CEFTRIAXONE <=0.25 SENSITIVE Sensitive     CIPROFLOXACIN <=0.25 SENSITIVE Sensitive     GENTAMICIN <=1 SENSITIVE Sensitive     IMIPENEM 4 SENSITIVE Sensitive     TRIMETH/SULFA <=20 SENSITIVE Sensitive     AMPICILLIN/SULBACTAM <=2 SENSITIVE Sensitive     PIP/TAZO <=4 SENSITIVE Sensitive     * RARE PROTEUS MIRABILIS     Labs:  CBC: Recent Labs  Lab 05/13/22 0405 05/14/22 0433 05/14/22 0551 05/14/22 1404 05/15/22 0854 05/16/22 0721  WBC 5.2 3.6* 3.9*  --  5.6 6.9  HGB 7.4* 6.4* 6.5* 8.0* 8.1* 9.5*  HCT 23.7* 20.9* 21.2* 24.8* 25.3* 30.4*  MCV 107.2* 109.4* 109.8*  --  102.4* 102.0*  PLT 93* 72* 71*  --  103* 135*   BMP &GFR Recent Labs  Lab 05/12/22 0440  05/13/22 0405 05/13/22 1058 05/13/22 1058 05/13/22 1647 05/14/22 0433 05/14/22 1714 05/15/22 0854 05/16/22 0721 05/17/22 0342  NA 149* 142  --   --    --  148*  --  143 143  --   K 3.6 3.7  --   --   --  3.5  --  2.9* 3.2*  --   CL 113* 109  --   --   --  113*  --  109 107  --   CO2 30 30  --   --   --  32  --  32 31  --   GLUCOSE 104* 125*  --   --   --  140*  --  94 97  --   BUN 7 12  --   --   --  21*  --  20 16  --   CREATININE 0.58 0.62  --   --   --  0.62  --  0.60 0.54  --   CALCIUM 8.1* 8.0*  --   --   --  8.9  --  8.5* 8.9  --   MG  --   --  1.8   < > 1.8 2.0 2.0  --  1.7 2.0  PHOS  --   --  3.8  --  3.3 3.0 2.5  --   --   --    < > = values in this interval not displayed.   Estimated Creatinine Clearance: 90.9 mL/min (by C-G formula based on SCr of 0.54 mg/dL). Liver & Pancreas: Recent Labs  Lab 05/12/22 0440 05/13/22 0405 05/14/22 0433 05/15/22 0854  AST 23 21 14* 14*  ALT ALKPHOS 122 133* 100 100  BILITOT 0.7 0.6 0.5 0.4  PROT 5.0* 5.1* 5.6* 4.9*  ALBUMIN 1.7* 1.7* 2.8* 2.3*   No results for input(s): LIPASE, AMYLASE in the last 168 hours. Recent Labs  Lab 05/12/22 0850  AMMONIA 13   Diabetic: No results for input(s): HGBA1C in the last 72 hours. Recent Labs  Lab 05/17/22 2022 05/17/22 2357 05/18/22 0512 05/18/22 0740 05/18/22 1211  GLUCAP 97 99 75 83 87   Cardiac Enzymes: No results for input(s): CKTOTAL, CKMB, CKMBINDEX, TROPONINI in the last 168 hours. No results for input(s): PROBNP in the last 8760 hours. Coagulation Profile: No results for input(s): INR, PROTIME in the last 168 hours. Thyroid Function Tests: No results for input(s): TSH, T4TOTAL, FREET4, T3FREE, THYROIDAB in the last 72 hours. Lipid Profile: No results for input(s): CHOL, HDL, LDLCALC, TRIG, CHOLHDL, LDLDIRECT in the last 72 hours. Anemia Panel: No results for input(s): VITAMINB12, FOLATE, FERRITIN, TIBC, IRON, RETICCTPCT in the last 72 hours. Urine analysis:    Component Value Date/Time   COLORURINE YELLOW 05/08/2022 2130   APPEARANCEUR CLEAR 05/08/2022 2130   LABSPEC 1.017 05/08/2022 2130   PHURINE 5.0  05/08/2022 2130   GLUCOSEU 50 (A) 05/08/2022 2130   HGBUR MODERATE (A) 05/08/2022 2130   BILIRUBINUR NEGATIVE 05/08/2022 2130   KETONESUR 20 (A) 05/08/2022 2130   PROTEINUR 30 (A) 05/08/2022 2130   NITRITE NEGATIVE 05/08/2022 2130   LEUKOCYTESUR NEGATIVE 05/08/2022 2130   Sepsis Labs: Invalid input(s): PROCALCITONIN, LACTICIDVEN   Time coordinating discharge: 45 minutes  SIGNED:  Almon Hercules, MD  [facility] 05/18/2022, 12:53 PM

## 2022-05-18 NOTE — TOC Progression Note (Addendum)
Transition of Care [facility]) - Progression Note    Patient Details  Name: AALAYSIA LIGGINS MRN: [id number] Date of Birth: 12-31-1975  [facility]) CM/SW Contact  Golda Acre, RN Phone Number: 05/18/2022, 9:05 AM  Clinical Narrative:    T[facility] at [facility] will look at patient and call back informed that patient was accepted in the hub and is ready for dc. Tcf-Grace can take patient today, request for dc summary to [email or web address] [phone]  Expected Discharge Plan: Skilled Nursing Facility Barriers to Discharge: Continued Medical Work up  Expected Discharge Plan and Services Expected Discharge Plan: Skilled Nursing Facility In-house Referral: Clinical Social Work     Living arrangements for the past 2 months: Apartment Expected Discharge Date: 05/18/22                                     Social Determinants of Health (SDOH) Interventions    Readmission Risk Interventions     View : No data to display.

## 2022-05-18 NOTE — TOC Transition Note (Signed)
Transition of Care [facility]) - CM/SW Discharge Note   Patient Details  Name: JAELYNNE POLLINS MRN: [id number] Date of Birth: 23-Jul-1976  Transition of Care [facility]) CM/SW Contact:  Leeroy Cha, RN Phone Number: 05/18/2022, 2:06 PM   Clinical Narrative:    Patient dcd to [place] room 119.  Transport packet to front desk. Rn made aware, ptar called at [phone].     Barriers to Discharge: Continued Medical Work up   Patient Goals and CMS Choice Patient states their goals for this hospitalization and ongoing recovery are:: pt intubated      Discharge Placement                       Discharge Plan and Services In-house Referral: Clinical Social Work                                   Social Determinants of Health (SDOH) Interventions     Readmission Risk Interventions     View : No data to display.

## 2022-05-18 NOTE — Plan of Care (Signed)
  Problem: Education: Goal: Knowledge of General Education information will improve Description Including pain rating scale, medication(s)/side effects and non-pharmacologic comfort measures Outcome: Progressing   Problem: Health Behavior/Discharge Planning: Goal: Ability to manage health-related needs will improve Outcome: Progressing   

## 2022-05-18 NOTE — Progress Notes (Signed)
Attempted to call report 4 times. This nurse sent to voicemail three times and told to call back due to nurse passing medications. Val Eagle
